# Patient Record
Sex: Female | Born: 1985 | Race: Black or African American | Hispanic: No | Marital: Single | Smoking: Former smoker
Health system: Southern US, Community
[De-identification: ages and names within clinical notes are randomized; demographics above are authoritative.]

## PROBLEM LIST (undated history)

## (undated) DIAGNOSIS — K5792 Diverticulitis of intestine, part unspecified, without perforation or abscess without bleeding: Secondary | ICD-10-CM

## (undated) DIAGNOSIS — G43909 Migraine, unspecified, not intractable, without status migrainosus: Secondary | ICD-10-CM

## (undated) DIAGNOSIS — E785 Hyperlipidemia, unspecified: Secondary | ICD-10-CM

## (undated) DIAGNOSIS — J45909 Unspecified asthma, uncomplicated: Secondary | ICD-10-CM

## (undated) HISTORY — PX: OTHER SURGICAL HISTORY: SHX169

## (undated) HISTORY — DX: Migraine, unspecified, not intractable, without status migrainosus: G43.909

## (undated) HISTORY — DX: Hyperlipidemia, unspecified: E78.5

## (undated) HISTORY — DX: Diverticulitis of intestine, part unspecified, without perforation or abscess without bleeding: K57.92

---

## 2007-07-05 ENCOUNTER — Emergency Department (HOSPITAL_COMMUNITY): Admission: EM | Admit: 2007-07-05 | Discharge: 2007-07-06 | Payer: Self-pay | Admitting: Emergency Medicine

## 2009-04-13 ENCOUNTER — Emergency Department (HOSPITAL_COMMUNITY): Admission: EM | Admit: 2009-04-13 | Discharge: 2009-04-13 | Payer: Self-pay | Admitting: Emergency Medicine

## 2009-06-28 ENCOUNTER — Emergency Department (HOSPITAL_COMMUNITY): Admission: EM | Admit: 2009-06-28 | Discharge: 2009-06-28 | Payer: Self-pay | Admitting: Emergency Medicine

## 2010-05-22 ENCOUNTER — Emergency Department (HOSPITAL_COMMUNITY)
Admission: EM | Admit: 2010-05-22 | Discharge: 2010-05-22 | Payer: Self-pay | Source: Home / Self Care | Admitting: Emergency Medicine

## 2010-08-05 LAB — RAPID STREP SCREEN (MED CTR MEBANE ONLY): Streptococcus, Group A Screen (Direct): NEGATIVE

## 2011-02-08 LAB — URINALYSIS, ROUTINE W REFLEX MICROSCOPIC
Glucose, UA: NEGATIVE
Ketones, ur: NEGATIVE
Nitrite: NEGATIVE
Protein, ur: NEGATIVE
Urobilinogen, UA: 1

## 2011-02-08 LAB — CBC
MCV: 86.8
Platelets: 429 — ABNORMAL HIGH
WBC: 12 — ABNORMAL HIGH

## 2011-02-08 LAB — DIFFERENTIAL
Basophils Relative: 0
Eosinophils Absolute: 0.1
Lymphs Abs: 2
Neutro Abs: 9.1 — ABNORMAL HIGH
Neutrophils Relative %: 76

## 2011-02-08 LAB — PREGNANCY, URINE: Preg Test, Ur: NEGATIVE

## 2011-02-08 LAB — BASIC METABOLIC PANEL
BUN: 10
Calcium: 8.4
Chloride: 105
Creatinine, Ser: 0.78
GFR calc Af Amer: >60

## 2011-07-16 ENCOUNTER — Ambulatory Visit (INDEPENDENT_AMBULATORY_CARE_PROVIDER_SITE_OTHER): Payer: Self-pay | Admitting: Family Medicine

## 2011-07-16 ENCOUNTER — Ambulatory Visit: Payer: Self-pay

## 2011-07-16 DIAGNOSIS — M25561 Pain in right knee: Secondary | ICD-10-CM

## 2011-07-16 DIAGNOSIS — R0789 Other chest pain: Secondary | ICD-10-CM

## 2011-07-16 DIAGNOSIS — R071 Chest pain on breathing: Secondary | ICD-10-CM

## 2011-07-16 DIAGNOSIS — M25569 Pain in unspecified knee: Secondary | ICD-10-CM

## 2011-07-16 LAB — D-DIMER, QUANTITATIVE: D-Dimer, Quant: 0.33 ug/mL-FEU (ref 0.00–0.48)

## 2011-07-16 MED ORDER — OXAPROZIN 600 MG PO TABS: ORAL_TABLET | ORAL | Status: DC

## 2011-07-16 NOTE — Patient Instructions (Signed)
Take medicine is ordered. If acute chest pain or worsening leg pain return here or go to the emergency room.

## 2011-07-16 NOTE — Progress Notes (Signed)
Subjective:  This is a 26 year old Afro-American female who comes in complaining of a couple of weeks of pain in her right knee and leg and in her left chest. The chest pain has some soreness to it when she rubs her upper chest above the breast. Last night it was sore and tender down into her left arm. She knows of no trauma. She works at [employer] where she has to walk back and forth a lot. She is on her feet the whole time, and has to climb up ladders to get the high shoes. She knows of no specific knee injury either. However it's been pretty painful and sore in the knee and down into the calf. It hurts all the way around to the knees. She does have a history of a possible Baker's cyst layer and the past some years ago. She's been hurting enough that she limped some.  Otherwise she has no major complaints. Cardiorespiratory unremarkable except for history of some asthma. GI unremarkable.  Objective: Neck supple chest clear to auscultation. Chest wall not very tender to palpation. Heart was regular without murmurs gallops arrhythmias. Her right knee seems to have a small effusion at, deftly so compared to the left. No erythema or ecchymosis is noted. There is tenderness all the way around the joint, but there is no joint laxity noted. The calf is mildly tender with negative Homans.  She does have a tiny bruise on the lateral aspect of her leg about 8 or 10 cm above the knee which almost has a linear nature to it.  [facility] reading (PRIMARY) by  Dr. Alwyn Ren. Chest: Essentially normal. Knee x-ray normal.   Assessment:  Left chest wall pain Right knee pain and effusion, with calf tenderness   Plan: EKG, chest x-ray, d-dimer, right knee x-ray Last menstrual period was in early February, 100% condom use.   I think that this is all musculoskeletal. We'll place on NSAID but am awaiting the d-dimer report also. Call or go to the emergency room if worse.

## 2011-07-16 NOTE — Progress Notes (Signed)
Subjective:  This is a 26 year old Afro-American female who comes in complaining of a couple of weeks of pain in her right knee and leg and in her left chest. The chest pain has some soreness to it when she rubs her upper chest above the breast. Last night it was sore and tender down into her left arm. She knows of no trauma. She works at [employer] where she has to walk back and forth a lot. She is on her feet the whole time, and has to climb up ladders to get the high shoes. She knows of no specific knee injury either. However it's been pretty painful and sore in the knee and down into the calf. It hurts all the way around to the knees. She does have a history of a possible Baker's cyst layer and the past some years ago. She's been hurting enough that she limped some.  Otherwise she has no major complaints. Cardiorespiratory unremarkable except for history of some asthma. GI unremarkable.  Objective: Neck supple chest clear to auscultation. Chest wall not very tender to palpation. Heart was regular without murmurs gallops arrhythmias. Her right knee seems to have a small effusion at, deftly so compared to the left. No erythema or ecchymosis is noted. There is tenderness all the way around the joint, but there is no joint laxity noted. The calf is mildly tender with negative Homans.  She does have a tiny bruise on the lateral aspect of her leg about 8 or 10 cm above the knee which almost has a linear nature to it.  [facility] reading (PRIMARY) by  Dr. Alwyn Ren. Chest: Essentially normal. Knee x-ray normal.   Assessment:  Left chest wall pain Right knee pain and effusion, with calf tenderness   Plan: EKG, chest x-ray, d-dimer, right knee x-ray Last menstrual period was in early February, 100% condom use.

## 2011-08-02 ENCOUNTER — Ambulatory Visit (INDEPENDENT_AMBULATORY_CARE_PROVIDER_SITE_OTHER): Payer: Self-pay | Admitting: Family Medicine

## 2011-08-02 ENCOUNTER — Ambulatory Visit: Payer: Self-pay

## 2011-08-02 VITALS — BP 131/86 | HR 91 | Temp 98.4°F | Resp 16

## 2011-08-02 DIAGNOSIS — M25539 Pain in unspecified wrist: Secondary | ICD-10-CM

## 2011-08-02 DIAGNOSIS — M25579 Pain in unspecified ankle and joints of unspecified foot: Secondary | ICD-10-CM

## 2011-08-02 DIAGNOSIS — S6000XA Contusion of unspecified finger without damage to nail, initial encounter: Secondary | ICD-10-CM

## 2011-08-02 DIAGNOSIS — S51809A Unspecified open wound of unspecified forearm, initial encounter: Secondary | ICD-10-CM

## 2011-08-02 DIAGNOSIS — S41109A Unspecified open wound of unspecified upper arm, initial encounter: Secondary | ICD-10-CM

## 2011-08-02 DIAGNOSIS — S93419A Sprain of calcaneofibular ligament of unspecified ankle, initial encounter: Secondary | ICD-10-CM

## 2011-08-02 MED ORDER — HYDROCODONE-ACETAMINOPHEN 5-325 MG PO TABS: 1.0000 | ORAL_TABLET | Freq: Every evening | ORAL | Status: AC | PRN

## 2011-08-02 NOTE — Progress Notes (Signed)
  Subjective:    Patient ID: Stephanie Lowe, female    DOB: [birth year], 26 y.o.   MRN: [id number]  HPI    Review of Systems     Objective:   Physical Exam        Assessment & Plan:  Discussed case and reviewed xrays with Porfirio Oar, PA and agree with plan.
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
## 2011-08-10 ENCOUNTER — Encounter: Payer: Self-pay | Admitting: *Deleted

## 2011-08-25 ENCOUNTER — Emergency Department (HOSPITAL_COMMUNITY)
Admission: EM | Admit: 2011-08-25 | Discharge: 2011-08-25 | Disposition: A | Discharge status: Home / Self Care | Payer: PRIVATE HEALTH INSURANCE | Attending: Emergency Medicine | Admitting: Emergency Medicine

## 2011-08-25 ENCOUNTER — Emergency Department (HOSPITAL_COMMUNITY): Payer: PRIVATE HEALTH INSURANCE

## 2011-08-25 ENCOUNTER — Encounter (HOSPITAL_COMMUNITY): Payer: Self-pay | Admitting: *Deleted

## 2011-08-25 DIAGNOSIS — M25473 Effusion, unspecified ankle: Secondary | ICD-10-CM | POA: Insufficient documentation

## 2011-08-25 DIAGNOSIS — S93409A Sprain of unspecified ligament of unspecified ankle, initial encounter: Secondary | ICD-10-CM | POA: Insufficient documentation

## 2011-08-25 DIAGNOSIS — M25476 Effusion, unspecified foot: Secondary | ICD-10-CM | POA: Insufficient documentation

## 2011-08-25 DIAGNOSIS — S93402A Sprain of unspecified ligament of left ankle, initial encounter: Secondary | ICD-10-CM

## 2011-08-25 MED ORDER — OXYCODONE-ACETAMINOPHEN 5-325 MG PO TABS: 1.0000 | ORAL_TABLET | ORAL | Status: AC | PRN
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
## 2011-08-25 NOTE — Discharge Instructions (Signed)
Ankle Sprain An ankle sprain is an injury to the strong, fibrous tissues (ligaments) that hold the bones of your ankle joint together.  CAUSES Ankle sprain usually is caused by a fall or by twisting your ankle. People who participate in sports are more prone to these types of injuries.  SYMPTOMS  Symptoms of ankle sprain include:  Pain in your ankle. The pain may be present at rest or only when you are trying to stand or walk.   Swelling.   Bruising. Bruising may develop immediately or within 1 to 2 days after your injury.   Difficulty standing or walking.  DIAGNOSIS  Your caregiver will ask you details about your injury and perform a physical exam of your ankle to determine if you have an ankle sprain. During the physical exam, your caregiver will press and squeeze specific areas of your foot and ankle. Your caregiver will try to move your ankle in certain ways. An X-ray exam may be done to be sure a bone was not broken or a ligament did not separate from one of the bones in your ankle (avulsion).  TREATMENT  Certain types of braces can help stabilize your ankle. Your caregiver can make a recommendation for this. Your caregiver may recommend the use of medication for pain. If your sprain is severe, your caregiver may refer you to a surgeon who helps to restore function to parts of your skeletal system (orthopedist) or a physical therapist. HOME CARE INSTRUCTIONS  Apply ice to your injury for 1 to 2 days or as directed by your caregiver. Applying ice helps to reduce inflammation and pain.  Put ice in a plastic bag.   Place a towel between your skin and the bag.   Leave the ice on for 15 to 20 minutes at a time, every 2 hours while you are awake.   Take over-the-counter or prescription medicines for pain, discomfort, or fever only as directed by your caregiver.   Keep your injured leg elevated, when possible, to lessen swelling.   If your caregiver recommends crutches, use them as  instructed. Gradually, put weight on the affected ankle. Continue to use crutches or a cane until you can walk without feeling pain in your ankle.   If you have a plaster splint, wear the splint as directed by your caregiver. Do not rest it on anything harder than a pillow the first 24 hours. Do not put weight on it. Do not get it wet. You may take it off to take a shower or bath.   You may have been given an elastic bandage to wear around your ankle to provide support. If the elastic bandage is too tight (you have numbness or tingling in your foot or your foot becomes cold and blue), adjust the bandage to make it comfortable.   If you have an air splint, you may blow more air into it or let air out to make it more comfortable. You may take your splint off at night and before taking a shower or bath.   Wiggle your toes in the splint several times per day if you are able.  SEEK MEDICAL CARE IF:   You have an increase in bruising, swelling, or pain.   Your toes feel cold.   Pain relief is not achieved with medication.  SEEK IMMEDIATE MEDICAL CARE IF: Your toes are numb or blue or you have severe pain. MAKE SURE YOU:   Understand these instructions.   Will watch your condition.     Will get help right away if you are not doing well or get worse.  Document Released: 05/03/2005 Document Revised: 04/22/2011 Document Reviewed: 12/06/2007 Exit[employer] Patient Information 2012 [employer]. 

## 2011-08-26 NOTE — ED Provider Notes (Signed)
Medical screening examination/treatment/procedure(s) were performed by non-physician practitioner and as supervising physician I was immediately available for consultation/collaboration.    Rebbeca Sheperd, MD 08/26/11 0012 

## 2012-02-23 ENCOUNTER — Encounter (HOSPITAL_COMMUNITY): Payer: Self-pay

## 2012-02-23 ENCOUNTER — Emergency Department (HOSPITAL_COMMUNITY): Payer: Self-pay

## 2012-02-23 ENCOUNTER — Emergency Department (HOSPITAL_COMMUNITY)
Admission: EM | Admit: 2012-02-23 | Discharge: 2012-02-23 | Disposition: A | Discharge status: Home / Self Care | Payer: Self-pay | Attending: Emergency Medicine | Admitting: Emergency Medicine

## 2012-02-23 DIAGNOSIS — R0789 Other chest pain: Secondary | ICD-10-CM | POA: Insufficient documentation

## 2012-02-23 DIAGNOSIS — J45909 Unspecified asthma, uncomplicated: Secondary | ICD-10-CM | POA: Insufficient documentation

## 2012-02-23 DIAGNOSIS — R0602 Shortness of breath: Secondary | ICD-10-CM | POA: Insufficient documentation

## 2012-02-23 HISTORY — DX: Unspecified asthma, uncomplicated: J45.909

## 2012-02-23 LAB — BASIC METABOLIC PANEL
Chloride: 104 mEq/L (ref 96–112)
GFR calc Af Amer: >90 mL/min (ref >90)
Potassium: 4.1 mEq/L (ref 3.5–5.1)

## 2012-02-23 LAB — CBC
Platelets: 401 10*3/uL — ABNORMAL HIGH (ref 150–400)
RBC: 4.13 MIL/uL (ref 3.87–5.11)
RDW: 12.9 % (ref 11.5–15.5)
WBC: 8.4 10*3/uL (ref 4.0–10.5)

## 2012-02-23 LAB — POCT I-STAT TROPONIN I: Troponin i, poc: 0 ng/mL (ref 0.00–0.08)

## 2012-02-23 MED ORDER — ALBUTEROL SULFATE (5 MG/ML) 0.5% IN NEBU
5.0000 mg | INHALATION_SOLUTION | Freq: Once | RESPIRATORY_TRACT | Status: AC
  Administered 2012-02-23: 5 mg via RESPIRATORY_TRACT
  Filled 2012-02-23: qty 0.5
  Filled 2012-02-23: qty 1

## 2012-02-23 NOTE — ED Provider Notes (Signed)
History     CSN: [id number]  Arrival date & time 02/23/12  1513   First MD Initiated Contact with Patient 02/23/12 2013      Chief Complaint  Patient presents with  . Chest Pain  . Shortness of Breath   HPI  History provided by the patient. Patient is a 26 year old female with prior history of asthma who is a current smoker and presents with complaints of persistent left upper chest pain and tenderness for the past 3 weeks. Patient denies any injury or trauma to the area. She reports point tenderness to the upper lateral left chest area worse with palpation. She denies any other aggravating or alleviating factors. Symptoms to have some waxing and waning nature but are persistent. Patient also complains of occasional heart palpitations or skipped beats that are brief lasting 1-2 seconds. Patient also reports a generalized shortness of breath is unchanged with activity or rest has been persistent which longer than her chest pain symptoms. Patient currently is not treated for her asthma is not use any medications for many years. She denies any increasing cough. Denies any wheezing. Denies any fever, chills or sweats. She denies any recent long travel, surgery, prior DVT or PE, prior diagnosis of cancer, estrogen or birth control use or any hemoptysis.    Past Medical History  Diagnosis Date  . Asthma     History reviewed. No pertinent past surgical history.  No family history on file.  History  Substance Use Topics  . Smoking status: Current Every Day Smoker -- 8 years  . Smokeless tobacco: Not on file  . Alcohol Use: 3.8 oz/week    3 Cans of beer, 4 Drinks containing 0.5 oz of alcohol per week    OB History    Grav Para Term Preterm Abortions TAB SAB Ect Mult Living                  Review of Systems  Constitutional: Negative for fever, chills and diaphoresis.  HENT: Negative for congestion and sore throat.   Respiratory: Positive for shortness of breath. Negative for cough  and wheezing.   Cardiovascular: Positive for chest pain and palpitations. Negative for leg swelling.  Gastrointestinal: Negative for nausea, vomiting, abdominal pain, diarrhea and constipation.  Skin: Negative for rash.  Neurological: Negative for weakness, light-headedness and numbness.    Allergies  Review of patient's allergies indicates no known allergies.  Home Medications  No current outpatient prescriptions on file.  BP 133/85  Pulse 79  Temp 99.2 F (37.3 C) (Oral)  Resp 18  Ht 5\' 2"  (1.575 m)  Wt 190 lb (86.183 kg)  BMI 34.75 kg/m2  SpO2 98%  LMP 02/23/2012  Physical Exam  Nursing note and vitals reviewed. Constitutional: She is oriented to person, place, and time. She appears well-developed and well-nourished. No distress.  HENT:  Head: Normocephalic.  Cardiovascular: Normal rate and regular rhythm.   No murmur heard. Pulmonary/Chest: Effort normal. No respiratory distress. She has wheezes. She has no rales. She exhibits tenderness.         Reproducible point tenderness to the left upper lateral chest wall over pectoralis major. No deformities or mass.  Abdominal: Soft. There is no tenderness. There is no rebound and no guarding.  Musculoskeletal: Normal range of motion. She exhibits no edema and no tenderness.       No clinical signs concerning for DVT  Neurological: She is alert and oriented to person, place, and time.  Skin: Skin is  warm and dry. No rash noted. No erythema.  Psychiatric: She has a normal mood and affect. Her behavior is normal.    ED Course  Procedures   Results for orders placed during the hospital encounter of 02/23/12  CBC      Component Value Range   WBC 8.4  4.0 - 10.5 K/uL   RBC 4.13  3.87 - 5.11 MIL/uL   Hemoglobin 12.2  12.0 - 15.0 g/dL   HCT 47.8  29.5 - 62.1 %   MCV 90.1  78.0 - 100.0 fL   MCH 29.5  26.0 - 34.0 pg   MCHC 32.8  30.0 - 36.0 g/dL   RDW 30.8  65.7 - 84.6 %   Platelets 401 (*) 150 - 400 K/uL  BASIC METABOLIC  PANEL      Component Value Range   Sodium 138  135 - 145 mEq/L   Potassium 4.1  3.5 - 5.1 mEq/L   Chloride 104  96 - 112 mEq/L   CO2 23  19 - 32 mEq/L   Glucose, Bld 98  70 - 99 mg/dL   BUN 14  6 - 23 mg/dL   Creatinine, Ser 9.62  0.50 - 1.10 mg/dL   Calcium 9.2  8.4 - 95.2 mg/dL   GFR calc non Af Amer 89 (*) >90 mL/min   GFR calc Af Amer >90  >90 mL/min  POCT I-STAT TROPONIN I      Component Value Range   Troponin i, poc 0.00  0.00 - 0.08 ng/mL   Comment 3               Dg Chest 2 View  02/23/2012  *RADIOLOGY REPORT*  Clinical Data: Chest pain.  Shortness of breath.  CHEST - 2 VIEW  Comparison: 07/16/2011.  Findings: No infiltrate, congestive heart failure or pneumothorax. Heart size within normal limits.  IMPRESSION: No acute abnormality.   Original Report Authenticated By: Fuller Canada, M.D.      1. Musculoskeletal chest pain       MDM  8:40 PM patient seen and evaluated. Patient resting comfortably. Patient with reproducible tenderness over the left upper lateral pectoralis area. No underlying deformities. Patient is PERC negative.  Symptoms have been persistent for the past 3 weeks. Patient has normal lab testing, negative troponin and unremarkable EKG. Does times feels symptoms are not related to a serious more likely muscle skeletal. Patient has been advised of the findings and treatment recommendations. Will discharge at this time     Date: 02/23/2012  Rate: 79  Rhythm: normal sinus rhythm  QRS Axis: normal  Intervals: normal  ST/T Wave abnormalities: normal  Conduction Disutrbances:none  Narrative Interpretation:   Old EKG Reviewed: none available     Angus Seller, [place] 02/23/12 2132

## 2012-02-23 NOTE — ED Notes (Signed)
Pt reports chest pain for 3 weeks. Unable to identify what started it. Upper left chest that radiates to stomach, sharp, "feels like something is pinching it". Pt denies new medications and taking any medication at home, new forms of exercise (but does exercise on a regular). Pt wonders if it is due to anxiety.

## 2012-02-23 NOTE — ED Notes (Signed)
Pt presents with NAD- Pt c/o of upper left sided chest pain x 3 weeks- Pt reports no cause for the pain-

## 2012-02-24 NOTE — ED Provider Notes (Signed)
Medical screening examination/treatment/procedure(s) were performed by non-physician practitioner and as supervising physician I was immediately available for consultation/collaboration.  Gerhard Munch, MD 02/24/12 [phone]

## 2012-09-13 ENCOUNTER — Encounter (HOSPITAL_COMMUNITY): Payer: Self-pay | Admitting: *Deleted

## 2012-09-13 ENCOUNTER — Emergency Department (HOSPITAL_COMMUNITY)
Admission: EM | Admit: 2012-09-13 | Discharge: 2012-09-13 | Disposition: A | Discharge status: Home / Self Care | Payer: Self-pay | Attending: Emergency Medicine | Admitting: Emergency Medicine

## 2012-09-13 DIAGNOSIS — Y9389 Activity, other specified: Secondary | ICD-10-CM | POA: Insufficient documentation

## 2012-09-13 DIAGNOSIS — Y9229 Other specified public building as the place of occurrence of the external cause: Secondary | ICD-10-CM | POA: Insufficient documentation

## 2012-09-13 DIAGNOSIS — J45909 Unspecified asthma, uncomplicated: Secondary | ICD-10-CM | POA: Insufficient documentation

## 2012-09-13 DIAGNOSIS — R112 Nausea with vomiting, unspecified: Secondary | ICD-10-CM | POA: Insufficient documentation

## 2012-09-13 DIAGNOSIS — X500XXA Overexertion from strenuous movement or load, initial encounter: Secondary | ICD-10-CM | POA: Insufficient documentation

## 2012-09-13 DIAGNOSIS — T148XXA Other injury of unspecified body region, initial encounter: Secondary | ICD-10-CM

## 2012-09-13 DIAGNOSIS — F17200 Nicotine dependence, unspecified, uncomplicated: Secondary | ICD-10-CM | POA: Insufficient documentation

## 2012-09-13 DIAGNOSIS — Z3202 Encounter for pregnancy test, result negative: Secondary | ICD-10-CM | POA: Insufficient documentation

## 2012-09-13 DIAGNOSIS — X503XXA Overexertion from repetitive movements, initial encounter: Secondary | ICD-10-CM | POA: Insufficient documentation

## 2012-09-13 DIAGNOSIS — IMO0002 Reserved for concepts with insufficient information to code with codable children: Secondary | ICD-10-CM | POA: Insufficient documentation

## 2012-09-13 DIAGNOSIS — R1012 Left upper quadrant pain: Secondary | ICD-10-CM | POA: Insufficient documentation

## 2012-09-13 LAB — COMPREHENSIVE METABOLIC PANEL
ALT: 14 U/L (ref 0–35)
AST: 18 U/L (ref 0–37)
Alkaline Phosphatase: 52 U/L (ref 39–117)
Calcium: 9.7 mg/dL (ref 8.4–10.5)
GFR calc Af Amer: >90 mL/min (ref >90)
Glucose, Bld: 91 mg/dL (ref 70–99)
Potassium: 3.9 mEq/L (ref 3.5–5.1)
Sodium: 137 mEq/L (ref 135–145)
Total Protein: 7.7 g/dL (ref 6.0–8.3)

## 2012-09-13 LAB — URINALYSIS, ROUTINE W REFLEX MICROSCOPIC
Bilirubin Urine: NEGATIVE
Glucose, UA: NEGATIVE mg/dL
Hgb urine dipstick: NEGATIVE
Specific Gravity, Urine: 1.024 (ref 1.005–1.030)
Urobilinogen, UA: 0.2 mg/dL (ref 0.0–1.0)
pH: 5.5 (ref 5.0–8.0)

## 2012-09-13 LAB — CBC WITH DIFFERENTIAL/PLATELET
Basophils Absolute: 0 10*3/uL (ref 0.0–0.1)
Eosinophils Absolute: 0.2 10*3/uL (ref 0.0–0.7)
Lymphocytes Relative: 24 % (ref 12–46)
Lymphs Abs: 2.4 10*3/uL (ref 0.7–4.0)
Neutrophils Relative %: 66 % (ref 43–77)
Platelets: 416 10*3/uL — ABNORMAL HIGH (ref 150–400)
RBC: 4.53 MIL/uL (ref 3.87–5.11)
RDW: 13 % (ref 11.5–15.5)
WBC: 10 10*3/uL (ref 4.0–10.5)

## 2012-09-13 LAB — POCT PREGNANCY, URINE: Preg Test, Ur: NEGATIVE

## 2012-09-13 MED ORDER — METHOCARBAMOL 500 MG PO TABS: 500.0000 mg | ORAL_TABLET | Freq: Two times a day (BID) | ORAL | Status: DC

## 2012-09-13 MED ORDER — IBUPROFEN 600 MG PO TABS: 600.0000 mg | ORAL_TABLET | Freq: Four times a day (QID) | ORAL | Status: DC | PRN

## 2012-09-13 NOTE — ED Provider Notes (Signed)
History     CSN: [id number]  Arrival date & time 09/13/12  1325   First MD Initiated Contact with Patient 09/13/12 1437      Chief Complaint  Patient presents with  . Abdominal Pain    (Consider location/radiation/quality/duration/timing/severity/associated sxs/prior treatment) HPI  27 year old female presents complaining of abdominal pain. Patient reports for the past week she has had persistent pain to her left upper quadrant abdomen. Describe pain as an aching throbbing sensation also with a poor sensation when she moves. Pain is worse with movement and worse with taking deep breath. She felt nauseous and vomits once that was several days ago. No specific treatment tried. Otherwise patient denies fever, chills, sneezing, coughing, sore throat, chest pain, dyspnea or exertion, productive cough, dysuria, vaginal discharge, or rash. She admits to starting to go to the gym for the past month has been performing some heavy lifting. State if he makes the pain worse. She also admits to drinking moderate amount of alcohol over the weekend. Denies prior history of pancreatitis. Denies history of diabetes. Does not take birth control pill, and had no other significant risk factors for PE.  Past Medical History  Diagnosis Date  . Asthma     History reviewed. No pertinent past surgical history.  History reviewed. No pertinent family history.  History  Substance Use Topics  . Smoking status: Current Every Day Smoker -- 8 years  . Smokeless tobacco: Not on file  . Alcohol Use: 3.8 oz/week    3 Cans of beer, 4 Drinks containing 0.5 oz of alcohol per week    OB History   Grav Para Term Preterm Abortions TAB SAB Ect Mult Living                  Review of Systems  Constitutional:       10 Systems reviewed and all are negative for acute change except as noted in the HPI.     Allergies  Review of patient's allergies indicates no known allergies.  Home Medications  No current  outpatient prescriptions on file.  BP 130/87  Pulse 87  Temp(Src) 99 F (37.2 C) (Oral)  Resp 20  SpO2 98%  LMP 08/28/2012  Physical Exam  Nursing note and vitals reviewed. Constitutional: She appears well-developed and well-nourished. No distress.  Awake, alert, nontoxic appearance  HENT:  Head: Atraumatic.  Eyes: Conjunctivae are normal. Right eye exhibits no discharge. Left eye exhibits no discharge.  Neck: Neck supple.  Cardiovascular: Normal rate and regular rhythm.   Pulmonary/Chest: Effort normal. No respiratory distress. She has no wheezes. She has no rales. She exhibits tenderness (tenderness to left paracostal region without crepitus, emphysema, or overlying skin changes).  Abdominal: Soft. Bowel sounds are normal. There is tenderness (Mild left upper quadrant tenderness without guarding or rebound tenderness.). There is no rebound.  Genitourinary:  No CVA tenderness  Musculoskeletal: She exhibits no tenderness.  ROM appears intact, no obvious focal weakness  Neurological:  Mental status and motor strength appears intact  Skin: No rash noted.  Psychiatric: She has a normal mood and affect.    ED Course  Procedures (including critical care time)  2:54 PM Patient with pain to the left upper quadrant, reproducible on exam. Pain is likely to be musculoskeletal in origin considering patient has recently started to go to the gym and workout. Since patient also admits to moderate alcohol use over the weekend, we'll consider pancreatitis as a possible cause. Labs will be checked in ED.  Pain medication offered, patient declined. Patient otherwise afebrile with stable normal vital sign. She has a nonsurgical abdomen.  3:55 PM Patient has normal lipase which makes her unlikely to have pancreatitis. Her labs otherwise unremarkable. I believe the pain is likely MSK. I recommend over-the-counter  ibuprofen, muscle relaxant. Return precautions discussed. Patient is stable for  discharge. All questions answered to patient's satisfaction. I recommend alcohol cessation.  Labs Reviewed  CBC WITH DIFFERENTIAL - Abnormal; Notable for the following:    Platelets 416 (*)    All other components within normal limits  COMPREHENSIVE METABOLIC PANEL - Abnormal; Notable for the following:    Total Bilirubin 0.2 (*)    All other components within normal limits  URINALYSIS, ROUTINE W REFLEX MICROSCOPIC - Abnormal; Notable for the following:    APPearance CLOUDY (*)    All other components within normal limits  LIPASE, BLOOD  POCT PREGNANCY, URINE   No results found.   1. Abdominal pain, left upper quadrant   2. Muscle strain       MDM  BP 130/87  Pulse 87  Temp(Src) 99 F (37.2 C) (Oral)  Resp 20  SpO2 98%  LMP 08/28/2012  I have reviewed nursing notes and vital signs. I personally reviewed the imaging tests through PACS system  I reviewed available ER/hospitalization records thought the EMR         Fayrene Helper, [place] 09/13/12 1603

## 2012-09-13 NOTE — ED Provider Notes (Signed)
Medical screening examination/treatment/procedure(s) were conducted as a shared visit with non-physician practitioner(s) and myself.  I personally evaluated the patient during the encounter Pt w left upper abd pain/abd wall pain. Worse w movement/turning. No nv. No fevers. No cough or sob. No cp.. No pleuritic pain. abd soft nt.   Suzi Roots, MD 09/13/12 [phone]

## 2012-09-13 NOTE — ED Notes (Addendum)
Pt c/o left upper abd pain x's 1 week. Reports nausea denies vomiting. Reports pain is worse with deep breath. Pt points under left breast when asked where is pain.

## 2012-09-13 NOTE — Progress Notes (Signed)
ED CM noted without coverage or PCP. Meet with patient, states, she will have health insurance through employer. She will obtain a PCP once she receives insurance card in mail. She states, she will return a copy of insurance card to registration.

## 2013-04-10 ENCOUNTER — Encounter (HOSPITAL_COMMUNITY): Payer: Self-pay | Admitting: Emergency Medicine

## 2013-04-10 ENCOUNTER — Emergency Department (HOSPITAL_COMMUNITY)
Admission: EM | Admit: 2013-04-10 | Discharge: 2013-04-10 | Disposition: A | Discharge status: Home / Self Care | Payer: Self-pay | Attending: Emergency Medicine | Admitting: Emergency Medicine

## 2013-04-10 ENCOUNTER — Emergency Department (HOSPITAL_COMMUNITY): Payer: Self-pay

## 2013-04-10 DIAGNOSIS — T148XXA Other injury of unspecified body region, initial encounter: Secondary | ICD-10-CM

## 2013-04-10 DIAGNOSIS — F17200 Nicotine dependence, unspecified, uncomplicated: Secondary | ICD-10-CM | POA: Insufficient documentation

## 2013-04-10 DIAGNOSIS — R109 Unspecified abdominal pain: Secondary | ICD-10-CM | POA: Insufficient documentation

## 2013-04-10 DIAGNOSIS — K2970 Gastritis, unspecified, without bleeding: Secondary | ICD-10-CM

## 2013-04-10 DIAGNOSIS — J45909 Unspecified asthma, uncomplicated: Secondary | ICD-10-CM | POA: Insufficient documentation

## 2013-04-10 DIAGNOSIS — Z3202 Encounter for pregnancy test, result negative: Secondary | ICD-10-CM | POA: Insufficient documentation

## 2013-04-10 LAB — CBC WITH DIFFERENTIAL/PLATELET
Basophils Absolute: 0 10*3/uL (ref 0.0–0.1)
Eosinophils Relative: 2 % (ref 0–5)
HCT: 39.7 % (ref 36.0–46.0)
Hemoglobin: 13.1 g/dL (ref 12.0–15.0)
Lymphocytes Relative: 20 % (ref 12–46)
Lymphs Abs: 1.7 10*3/uL (ref 0.7–4.0)
MCV: 89.8 fL (ref 78.0–100.0)
Monocytes Absolute: 0.6 10*3/uL (ref 0.1–1.0)
Monocytes Relative: 7 % (ref 3–12)
Neutro Abs: 6.1 10*3/uL (ref 1.7–7.7)
RBC: 4.42 MIL/uL (ref 3.87–5.11)
RDW: 12.7 % (ref 11.5–15.5)
WBC: 8.6 10*3/uL (ref 4.0–10.5)

## 2013-04-10 LAB — COMPREHENSIVE METABOLIC PANEL
AST: 14 U/L (ref 0–37)
CO2: 22 mEq/L (ref 19–32)
Calcium: 9.1 mg/dL (ref 8.4–10.5)
Chloride: 106 mEq/L (ref 96–112)
Creatinine, Ser: 0.83 mg/dL (ref 0.50–1.10)
GFR calc Af Amer: >90 mL/min (ref >90)
GFR calc non Af Amer: >90 mL/min (ref >90)
Glucose, Bld: 99 mg/dL (ref 70–99)
Total Bilirubin: 0.3 mg/dL (ref 0.3–1.2)

## 2013-04-10 LAB — URINE MICROSCOPIC-ADD ON

## 2013-04-10 LAB — URINALYSIS, ROUTINE W REFLEX MICROSCOPIC
Glucose, UA: NEGATIVE mg/dL
Ketones, ur: NEGATIVE mg/dL
Leukocytes, UA: NEGATIVE
Protein, ur: NEGATIVE mg/dL
Urobilinogen, UA: 0.2 mg/dL (ref 0.0–1.0)

## 2013-04-10 MED ORDER — RANITIDINE HCL 150 MG PO TABS: 150.0000 mg | ORAL_TABLET | Freq: Two times a day (BID) | ORAL | Status: DC

## 2013-04-10 MED ORDER — CYCLOBENZAPRINE HCL 10 MG PO TABS: 10.0000 mg | ORAL_TABLET | Freq: Two times a day (BID) | ORAL | Status: DC | PRN

## 2013-04-10 NOTE — Progress Notes (Signed)
[facility] provided pt with a list of primary care resources and a [facility] [employer] [facility] application.  °

## 2013-04-10 NOTE — ED Notes (Signed)
Pt c/o lt upper side/flank pain x3wks after stretching. Pt states felt a knot in the center of her abdominal and moved to the side. Pt denies n/v/diarrhea or problems urinating

## 2013-04-10 NOTE — ED Provider Notes (Signed)
CSN: [id number]     Arrival date & time 04/10/13  0844 History   First MD Initiated Contact with Patient 04/10/13 [phone]     Chief Complaint  Patient presents with  . Flank Pain   (Consider location/radiation/quality/duration/timing/severity/associated sxs/prior Treatment) HPI Comments: Patient presents to the ER for evaluation of left abdomen and flank pain. Patient reports that she started having pain in the left upper abdomen area approximately 3 weeks ago when she was bent over stretching. Since then the pain has been constant but mild to moderate. She reports that today, however, she has had increased pain, now more severe and radiating around the left side to the back. No nausea, vomiting, diarrhea or constipation. She denies urinary symptoms. Patient does report that she had a few drinks the other night and that made the pain worse.  Patient is a 27 y.o. female presenting with flank pain.  Flank Pain Associated symptoms include abdominal pain. Pertinent negatives include no shortness of breath.    Past Medical History  Diagnosis Date  . Asthma    No past surgical history on file. No family history on file. History  Substance Use Topics  . Smoking status: Current Every Day Smoker -- 8 years  . Smokeless tobacco: Not on file  . Alcohol Use: 3.8 oz/week    3 Cans of beer, 4 Drinks containing 0.5 oz of alcohol per week   OB History   Grav Para Term Preterm Abortions TAB SAB Ect Mult Living                 Review of Systems  Respiratory: Negative for cough and shortness of breath.   Gastrointestinal: Positive for abdominal pain.  Genitourinary: Positive for flank pain.  All other systems reviewed and are negative.    Allergies  Review of patient's allergies indicates no known allergies.  Home Medications  No current outpatient prescriptions on file. There were no vitals taken for this visit. Physical Exam  Constitutional: She is oriented to person, place, and time.  She appears well-developed and well-nourished. No distress.  HENT:  Head: Normocephalic and atraumatic.  Right Ear: Hearing normal.  Left Ear: Hearing normal.  Nose: Nose normal.  Mouth/Throat: Oropharynx is clear and moist and mucous membranes are normal.  Eyes: Conjunctivae and EOM are normal. Pupils are equal, round, and reactive to light.  Neck: Normal range of motion. Neck supple.  Cardiovascular: Regular rhythm, S1 normal and S2 normal.  Exam reveals no gallop and no friction rub.   No murmur heard. Pulmonary/Chest: Effort normal and breath sounds normal. No respiratory distress. She exhibits no tenderness.  Abdominal: Soft. Normal appearance and bowel sounds are normal. There is no hepatosplenomegaly. There is tenderness in the epigastric area and left upper quadrant. There is no rebound, no guarding, no tenderness at McBurney's point and negative Murphy's sign. No hernia.  No tenderness below the umbilicus  Musculoskeletal: Normal range of motion.  Neurological: She is alert and oriented to person, place, and time. She has normal strength. No cranial nerve deficit or sensory deficit. Coordination normal. GCS eye subscore is 4. GCS verbal subscore is 5. GCS motor subscore is 6.  Skin: Skin is warm, dry and intact. No rash noted. No cyanosis.  Psychiatric: She has a normal mood and affect. Her speech is normal and behavior is normal. Thought content normal.    ED Course  Procedures (including critical care time) Labs Review Labs Reviewed  CBC WITH DIFFERENTIAL  COMPREHENSIVE METABOLIC PANEL  LIPASE, BLOOD  URINALYSIS, ROUTINE W REFLEX MICROSCOPIC   Imaging Review No results found.  EKG Interpretation   None       MDM  Diagnosis: Abdominal and flank pain  Patient comes to the ER for evaluation of left upper patient has been going on for several weeks. She reports it started when she bent over and stretched. A double wall muscle strain would be considered a strong  possibility. She has, however, now has some increased pain and the pain is now going around into the back and flank area. She says that it worsened when she drank alcohol. Peptic ulcer disease, gastritis, etc. would also be considered. Blood work is normal. No sign of anemia. All labs were normal and urinalysis was unremarkable. No consideration for urinary tract infection, pyelonephritis, ureterolithiasis. Patient will be treated empirically for muscle strain and possible gastritis secondary to her alcohol intake several days ago.    Gilda Crease, MD 04/10/13 [phone]

## 2014-02-07 IMAGING — CR DG CHEST 2V
2 series · 2 of 2 positions shown · non-contrast
Comparison: None.

CLINICAL DATA: Chest pain for 2 weeks.

CHEST - 2 VIEW

[PA]
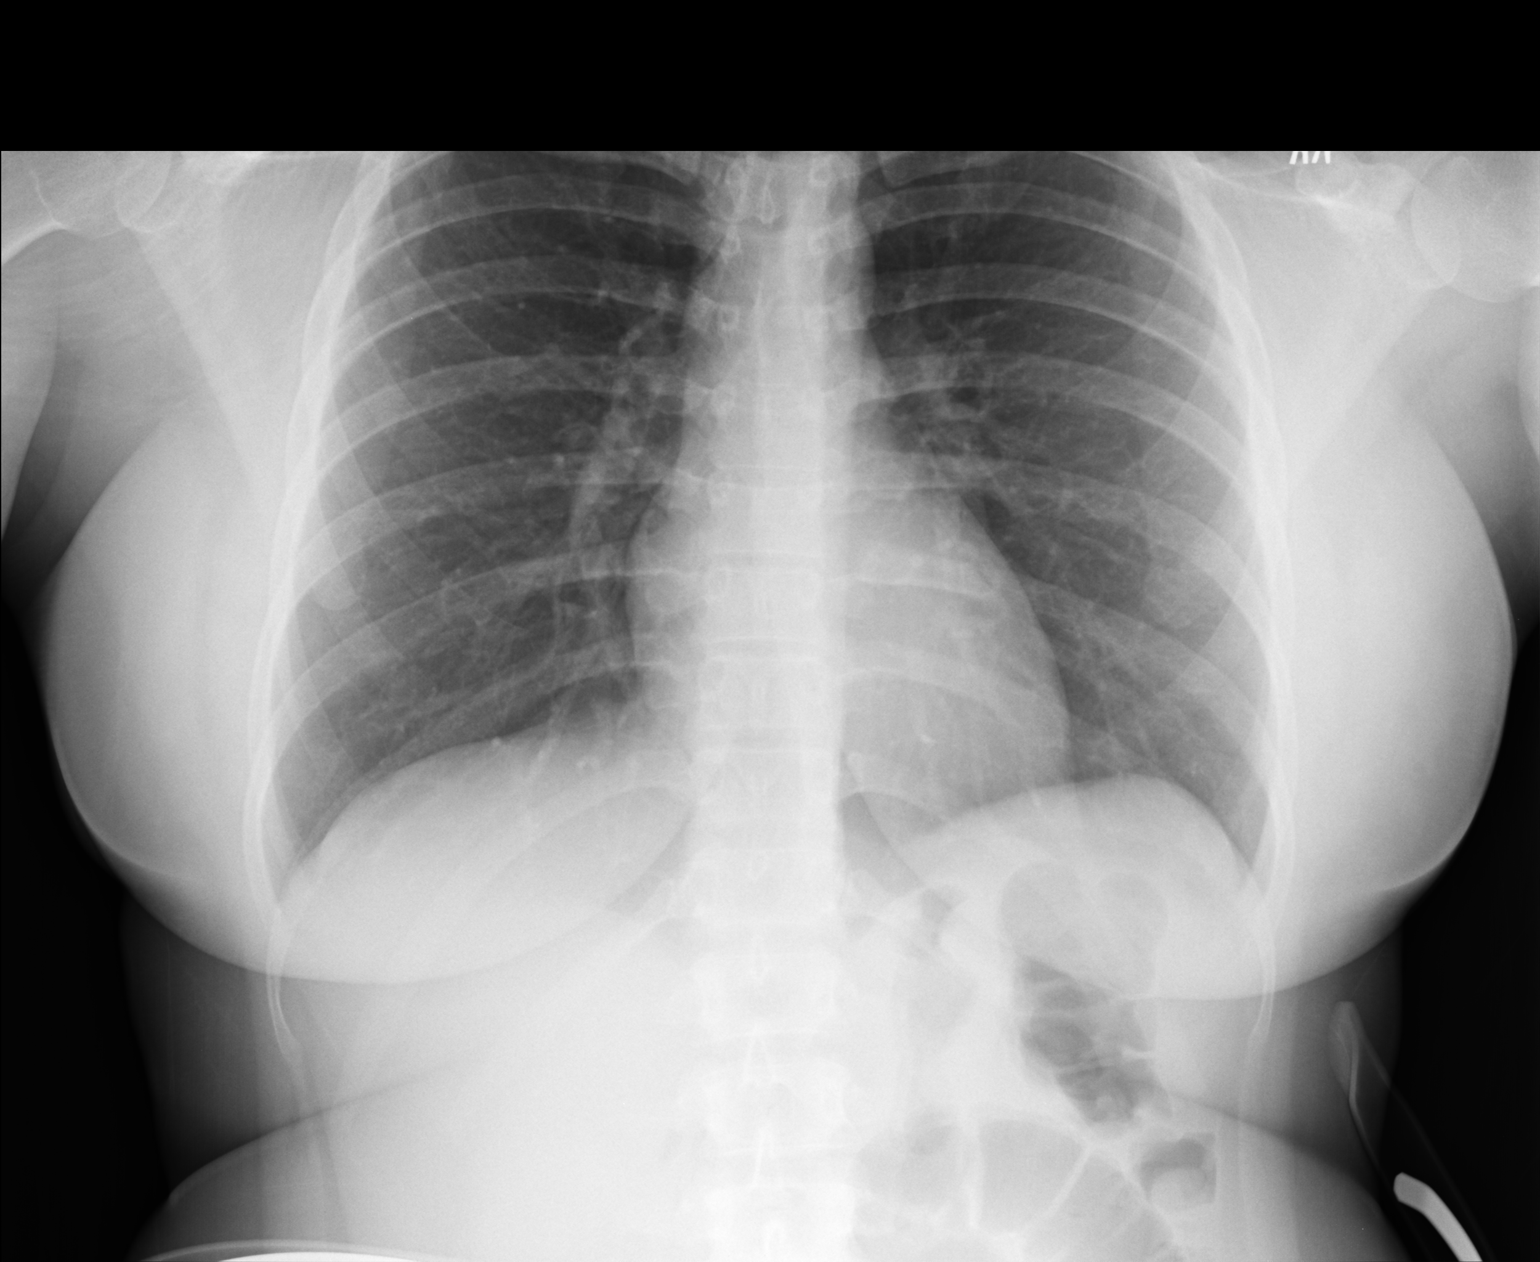

[lateral]
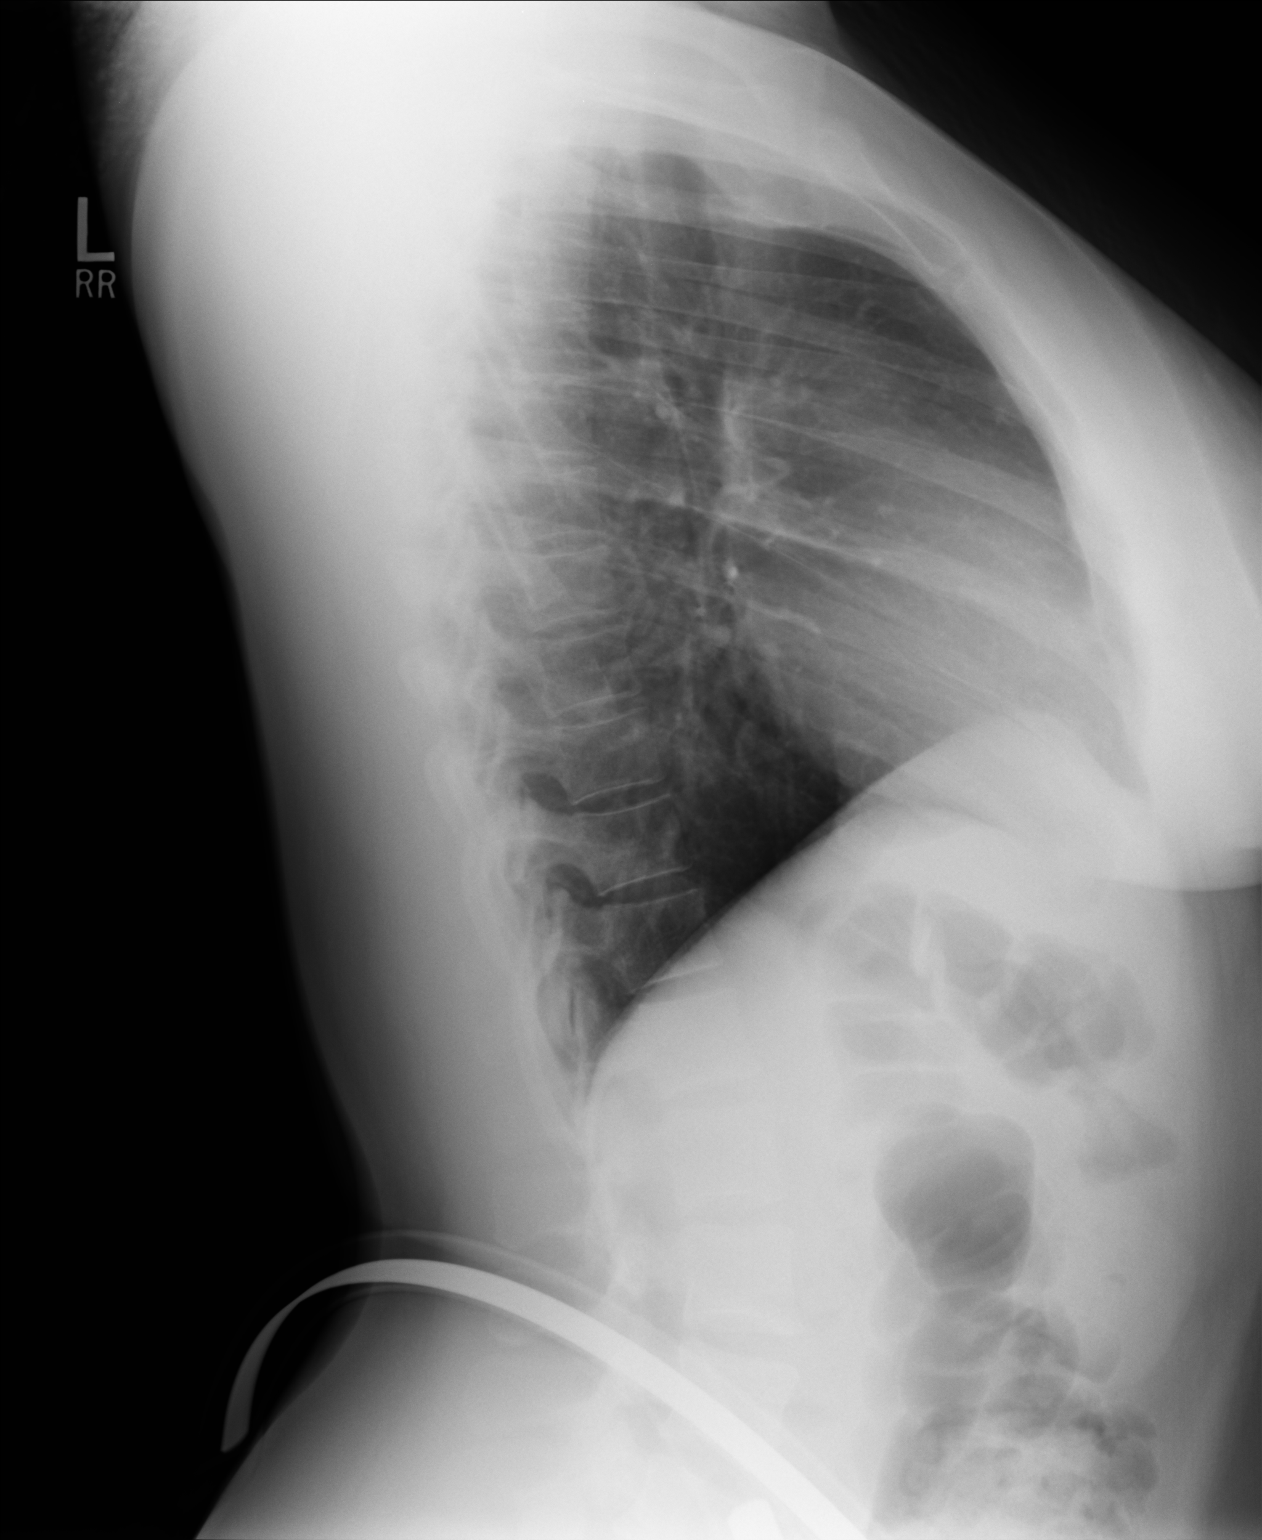

[2 of 2 positions shown; findings below may reference images not displayed]

FINDINGS: The heart size and mediastinal contours are normal. The
lungs are clear. There is no pleural effusion or pneumothorax. No
acute osseous findings are identified.  There is apparent artifact
from the patient's hair overlapping the upper chest bilaterally.
IMPRESSION: No active cardiopulmonary process.

## 2014-08-02 ENCOUNTER — Encounter (HOSPITAL_COMMUNITY): Payer: Self-pay

## 2014-08-02 ENCOUNTER — Emergency Department (HOSPITAL_COMMUNITY)
Admission: EM | Admit: 2014-08-02 | Discharge: 2014-08-02 | Disposition: A | Discharge status: Home / Self Care | Payer: 59 | Attending: Emergency Medicine | Admitting: Emergency Medicine

## 2014-08-02 DIAGNOSIS — J029 Acute pharyngitis, unspecified: Secondary | ICD-10-CM | POA: Insufficient documentation

## 2014-08-02 DIAGNOSIS — R000 Tachycardia, unspecified: Secondary | ICD-10-CM | POA: Diagnosis not present

## 2014-08-02 DIAGNOSIS — Z720 Tobacco use: Secondary | ICD-10-CM | POA: Diagnosis not present

## 2014-08-02 DIAGNOSIS — Z79899 Other long term (current) drug therapy: Secondary | ICD-10-CM | POA: Diagnosis not present

## 2014-08-02 DIAGNOSIS — J45909 Unspecified asthma, uncomplicated: Secondary | ICD-10-CM | POA: Insufficient documentation

## 2014-08-02 LAB — MONONUCLEOSIS SCREEN: MONO SCREEN: NEGATIVE

## 2014-08-02 LAB — RAPID STREP SCREEN (MED CTR MEBANE ONLY): Streptococcus, Group A Screen (Direct): NEGATIVE

## 2014-08-02 MED ORDER — CLINDAMYCIN HCL 150 MG PO CAPS: 300.0000 mg | ORAL_CAPSULE | Freq: Three times a day (TID) | ORAL | Status: DC

## 2014-08-02 NOTE — ED Notes (Signed)
Pt complains of a sore throat with white patches since Monday

## 2014-08-02 NOTE — ED Provider Notes (Signed)
CSN: [id number]     Arrival date & time 08/02/14  0700 History   First MD Initiated Contact with Patient 08/02/14 [phone]     Chief Complaint  Patient presents with  . Sore Throat     Patient is a 29 y.o. female presenting with pharyngitis. The history is provided by the patient. No language interpreter was used.  Sore Throat   Ms. Stephanie Lowe presents for evaluation of sore throat. She developed pain in the left side of her throat 4 days ago, which later spread to the right side of her throat. She noticed that there is multiple white patches in the back of her throat. She denies any fevers, nausea, vomiting, cough. She feels as if there is also mucous in her throat and at times she is short of breath from this and she has pain with swallowing. She was seen in urgent care 4 days ago and had a negative strep swab at that time and was started on steroids. She states that when they did the strep swab they didn't touch her tonsils. Symptoms are moderate, constant, worsening.  Past Medical History  Diagnosis Date  . Asthma    History reviewed. No pertinent past surgical history. History reviewed. No pertinent family history. History  Substance Use Topics  . Smoking status: Current Every Day Smoker -- 8 years  . Smokeless tobacco: Not on file  . Alcohol Use: 3.8 oz/week    3 Cans of beer, 4 Standard drinks or equivalent per week   OB History    No data available     Review of Systems  All other systems reviewed and are negative.     Allergies  Review of patient's allergies indicates no known allergies.  Home Medications   Prior to Admission medications   Medication Sig Start Date End Date Taking? Authorizing Provider  cyclobenzaprine (FLEXERIL) 10 MG tablet Take 1 tablet (10 mg total) by mouth 2 (two) times daily as needed for muscle spasms. 04/10/13   Gilda Crease, MD  ranitidine (ZANTAC) 150 MG tablet Take 1 tablet (150 mg total) by mouth 2 (two) times daily. 04/10/13    Gilda Crease, MD   BP 141/96 mmHg  Pulse 104  Temp(Src) 99.3 F (37.4 C) (Oral)  Resp 20  SpO2 99%  LMP 08/02/2014 Physical Exam  Constitutional: She is oriented to person, place, and time. She appears well-developed and well-nourished.  HENT:  Head: Normocephalic and atraumatic.  Right Ear: External ear normal.  Left Ear: External ear normal.  Moderate bilateral tonsillar swelling, left greater than right with multiple ulcerations and white patches.  Eyes: Pupils are equal, round, and reactive to light.  Neck: Neck supple.  Mild cervical lymphadenopathy  Cardiovascular: Regular rhythm.   No murmur heard. Tachycardic  Pulmonary/Chest: Effort normal and breath sounds normal. No respiratory distress.  Abdominal: Soft. There is no tenderness. There is no rebound and no guarding.  Musculoskeletal: She exhibits no edema or tenderness.  Neurological: She is alert and oriented to person, place, and time.  Skin: Skin is warm and dry.  Psychiatric: She has a normal mood and affect. Her behavior is normal.  Nursing note and vitals reviewed.   ED Course  Procedures (including critical care time) Labs Review Labs Reviewed  RAPID STREP SCREEN  CULTURE, GROUP A STREP  MONONUCLEOSIS SCREEN    Imaging Review No results found.   EKG Interpretation None      MDM   Final diagnoses:  Acute pharyngitis, unspecified  pharyngitis type    Patient here for tonsillar swelling, pain, exudate. Exam consistent with pharyngitis. Given tonsillar asymmetry concern for very early PTA and will treat with clindamycin with close return precautions. Patient is nontoxic on exam with no evidence of respiratory distress was tolerating oral fluids without difficulty.    Tilden FossaElizabeth Rayssa Atha, MD 08/02/14 [phone]

## 2014-08-02 NOTE — Discharge Instructions (Signed)

## 2014-08-04 ENCOUNTER — Ambulatory Visit (INDEPENDENT_AMBULATORY_CARE_PROVIDER_SITE_OTHER): Payer: 59 | Admitting: Emergency Medicine

## 2014-08-04 VITALS — BP 122/82 | HR 118 | Temp 97.6°F | Resp 16

## 2014-08-04 DIAGNOSIS — J039 Acute tonsillitis, unspecified: Secondary | ICD-10-CM | POA: Diagnosis not present

## 2014-08-04 DIAGNOSIS — R0789 Other chest pain: Secondary | ICD-10-CM

## 2014-08-04 DIAGNOSIS — M940 Chondrocostal junction syndrome [Tietze]: Secondary | ICD-10-CM | POA: Diagnosis not present

## 2014-08-04 LAB — CULTURE, GROUP A STREP: STREP A CULTURE: NEGATIVE

## 2014-08-04 MED ORDER — NAPROXEN SODIUM 550 MG PO TABS: 550.0000 mg | ORAL_TABLET | Freq: Two times a day (BID) | ORAL | Status: AC

## 2014-08-04 MED ORDER — AMOXICILLIN-POT CLAVULANATE 875-125 MG PO TABS: 1.0000 | ORAL_TABLET | Freq: Two times a day (BID) | ORAL | Status: DC

## 2014-08-04 MED ORDER — HYDROCODONE-ACETAMINOPHEN 5-325 MG PO TABS: 1.0000 | ORAL_TABLET | ORAL | Status: DC | PRN

## 2014-08-04 NOTE — Progress Notes (Signed)
[facility] [address] [place] [address] [phone]  Date:  08/04/2014   Name:  Stephanie Lowe   DOB:  February 27, 1986   MRN:  [id number]  PCP:  No primary care provider on file.    Chief Complaint: Chest Pain and Sore Throat   History of Present Illness:  Stephanie Lowe is a 29 y.o. very pleasant female patient who presents with the following:  Patient gives a history of sore throat, swollen tonsils and an exudate She was seen in an [facility] and on 3/16 in ER.  Had negative strep and mono screens but culture not reported. Says fever has been "high". She has an intermittent cough that is not productive. Over past 24 hours developed a left anterior parasternal pain that is pleuritic No history of overuse or injury No nausea or vomiting. Says throat is worse No improvement with over the counter medications or other home remedies Has no history of HBP, HLD, DM, occasional social smoker.  No family history of premature CAD Premenopausal. Denies other complaint or health concern today.   There are no active problems to display for this patient.   Past Medical History  Diagnosis Date  . Asthma     No past surgical history on file.  History  Substance Use Topics  . Smoking status: Current Every Day Smoker -- 8 years  . Smokeless tobacco: Not on file  . Alcohol Use: 3.8 oz/week    3 Cans of beer, 4 Standard drinks or equivalent per week    No family history on file.  No Known Allergies  Medication list has been reviewed and updated.  Current Outpatient Prescriptions on File Prior to Visit  Medication Sig Dispense Refill  . clindamycin (CLEOCIN) 150 MG capsule Take 2 capsules (300 mg total) by mouth 3 (three) times daily. 42 capsule 0  . albuterol (PROVENTIL HFA;VENTOLIN HFA) 108 (90 BASE) MCG/ACT inhaler Inhale 1-2 puffs into the lungs every 6 (six) hours as needed for wheezing or shortness of breath.    . cyclobenzaprine (FLEXERIL) 10 MG  tablet Take 1 tablet (10 mg total) by mouth 2 (two) times daily as needed for muscle spasms. (Patient not taking: Reported on 08/04/2014) 20 tablet 0  . nitrofurantoin, macrocrystal-monohydrate, (MACROBID) 100 MG capsule Take 100 mg by mouth 2 (two) times daily.    Marland Kitchen PREDNISONE PO Take 20 mg by mouth daily.     . ranitidine (ZANTAC) 150 MG tablet Take 1 tablet (150 mg total) by mouth 2 (two) times daily. (Patient not taking: Reported on 08/04/2014) 60 tablet 0   No current facility-administered medications on file prior to visit.    Review of Systems:  As per HPI, otherwise negative.    Physical Examination: Filed Vitals:   08/04/14 1211  BP: 122/82  Pulse: 118  Temp: 97.6 F (36.4 C)  Resp: 16   There were no vitals filed for this visit. There is no weight on file to calculate BMI. Ideal Body Weight:    GEN: WDWN, NAD, Non-toxic, A & O x 3 HEENT: Atraumatic, Normocephalic. Neck supple. No masses, No LAD.  Symmetrical exudative tonsillitis Ears and Nose: No external deformity. CV: RRR, No M/G/R. No JVD. No thrill. No extra heart sounds. PULM: CTA B, no wheezes, crackles, rhonchi. No retractions. No resp. distress. No accessory muscle use.  Left inferior parasternal tenderness.  Reproduces her chest pain ABD: S, NT, ND, +BS. No rebound. No HSM. EXTR: No c/c/e NEURO Normal gait.  PSYCH: Normally interactive. Conversant. Not depressed or anxious appearing.  Calm demeanor.    Assessment and Plan: Tonsillitis Costochondritis vicodin augmentin  Signed,  Phillips OdorJeffery Anderson, MD

## 2014-08-04 NOTE — Patient Instructions (Signed)
Costochondritis Costochondritis, sometimes called Tietze syndrome, is a swelling and irritation (inflammation) of the tissue (cartilage) that connects your ribs with your breastbone (sternum). It causes pain in the chest and rib area. Costochondritis usually goes away on its own over time. It can take up to 6 weeks or longer to get better, especially if you are unable to limit your activities. CAUSES  Some cases of costochondritis have no known cause. Possible causes include:  Injury (trauma).  Exercise or activity such as lifting.  Severe coughing. SIGNS AND SYMPTOMS  Pain and tenderness in the chest and rib area.  Pain that gets worse when coughing or taking deep breaths.  Pain that gets worse with specific movements. DIAGNOSIS  Your health care provider will do a physical exam and ask about your symptoms. Chest X-rays or other tests may be done to rule out other problems. TREATMENT  Costochondritis usually goes away on its own over time. Your health care provider may prescribe medicine to help relieve pain. HOME CARE INSTRUCTIONS   Avoid exhausting physical activity. Try not to strain your ribs during normal activity. This would include any activities using chest, abdominal, and side muscles, especially if heavy weights are used.  Apply ice to the affected area for the first 2 days after the pain begins.  Put ice in a plastic bag.  Place a towel between your skin and the bag.  Leave the ice on for 20 minutes, 2-3 times a day.  Only take over-the-counter or prescription medicines as directed by your health care provider. SEEK MEDICAL CARE IF:  You have redness or swelling at the rib joints. These are signs of infection.  Your pain does not go away despite rest or medicine. SEEK IMMEDIATE MEDICAL CARE IF:   Your pain increases or you are very uncomfortable.  You have shortness of breath or difficulty breathing.  You cough up blood.  You have worse chest pains,  sweating, or vomiting.  You have a fever or persistent symptoms for more than 2-3 days.  You have a fever and your symptoms suddenly get worse. MAKE SURE YOU:   Understand these instructions.  Will watch your condition.  Will get help right away if you are not doing well or get worse. Document Released: 02/10/2005 Document Revised: 02/21/2013 Document Reviewed: 12/05/2012 [facility] Patient Information 2015 [place], [place]. This information is not intended to replace advice given to you by your health care provider. Make sure you discuss any questions you have with your health care provider. Tonsillitis Tonsillitis is an infection of the throat that causes the tonsils to become red, tender, and swollen. Tonsils are collections of lymphoid tissue at the back of the throat. Each tonsil has crevices (crypts). Tonsils help fight nose and throat infections and keep infection from spreading to other parts of the body for the first 18 months of life.  CAUSES Sudden (acute) tonsillitis is usually caused by infection with streptococcal bacteria. Long-lasting (chronic) tonsillitis occurs when the crypts of the tonsils become filled with pieces of food and bacteria, which makes it easy for the tonsils to become repeatedly infected. SYMPTOMS  Symptoms of tonsillitis include:  A sore throat, with possible difficulty swallowing.  White patches on the tonsils.  Fever.  Tiredness.  New episodes of snoring during sleep, when you did not snore before.  Small, foul-smelling, yellowish-white pieces of material (tonsilloliths) that you occasionally cough up or spit out. The tonsilloliths can also cause you to have bad breath. DIAGNOSIS Tonsillitis can be  diagnosed through a physical exam. Diagnosis can be confirmed with the results of lab tests, including a throat culture. TREATMENT  The goals of tonsillitis treatment include the reduction of the severity and duration of symptoms and prevention of  associated conditions. Symptoms of tonsillitis can be improved with the use of steroids to reduce the swelling. Tonsillitis caused by bacteria can be treated with antibiotic medicines. Usually, treatment with antibiotic medicines is started before the cause of the tonsillitis is known. However, if it is determined that the cause is not bacterial, antibiotic medicines will not treat the tonsillitis. If attacks of tonsillitis are severe and frequent, your health care provider may recommend surgery to remove the tonsils (tonsillectomy). HOME CARE INSTRUCTIONS   Rest as much as possible and get plenty of sleep.  Drink plenty of fluids. While the throat is very sore, eat soft foods or liquids, such as sherbet, soups, or instant breakfast drinks.  Eat frozen ice pops.  Gargle with a warm or cold liquid to help soothe the throat. Mix 1/4 teaspoon of salt and 1/4 teaspoon of baking soda in 8 oz of water. SEEK MEDICAL CARE IF:   Large, tender lumps develop in your neck.  A rash develops.  A green, yellow-brown, or bloody substance is coughed up.  You are unable to swallow liquids or food for 24 hours.  You notice that only one of the tonsils is swollen. SEEK IMMEDIATE MEDICAL CARE IF:   You develop any new symptoms such as vomiting, severe headache, stiff neck, chest pain, or trouble breathing or swallowing.  You have severe throat pain along with drooling or voice changes.  You have severe pain, unrelieved with recommended medications.  You are unable to fully open the mouth.  You develop redness, swelling, or severe pain anywhere in the neck.  You have a fever. MAKE SURE YOU:   Understand these instructions.  Will watch your condition.  Will get help right away if you are not doing well or get worse. Document Released: 02/10/2005 Document Revised: 09/17/2013 Document Reviewed: 10/20/2012 [facility] Patient Information 2015 [place]. This information is not intended to replace  advice given to you by your health care provider. Make sure you discuss any questions you have with your health care provider.

## 2014-08-08 ENCOUNTER — Emergency Department (HOSPITAL_COMMUNITY)
Admission: EM | Admit: 2014-08-08 | Discharge: 2014-08-08 | Disposition: A | Discharge status: Home / Self Care | Payer: 59 | Attending: Emergency Medicine | Admitting: Emergency Medicine

## 2014-08-08 ENCOUNTER — Emergency Department (HOSPITAL_COMMUNITY): Payer: 59

## 2014-08-08 DIAGNOSIS — J45909 Unspecified asthma, uncomplicated: Secondary | ICD-10-CM | POA: Insufficient documentation

## 2014-08-08 DIAGNOSIS — Z720 Tobacco use: Secondary | ICD-10-CM | POA: Diagnosis not present

## 2014-08-08 DIAGNOSIS — Z79899 Other long term (current) drug therapy: Secondary | ICD-10-CM | POA: Diagnosis not present

## 2014-08-08 DIAGNOSIS — R091 Pleurisy: Secondary | ICD-10-CM

## 2014-08-08 DIAGNOSIS — R079 Chest pain, unspecified: Secondary | ICD-10-CM

## 2014-08-08 DIAGNOSIS — Z792 Long term (current) use of antibiotics: Secondary | ICD-10-CM | POA: Diagnosis not present

## 2014-08-08 LAB — CBC
HCT: 41.4 % (ref 36.0–46.0)
HEMOGLOBIN: 13.5 g/dL (ref 12.0–15.0)
MCH: 29.8 pg (ref 26.0–34.0)
MCHC: 32.6 g/dL (ref 30.0–36.0)
MCV: 91.4 fL (ref 78.0–100.0)
Platelets: 445 10*3/uL — ABNORMAL HIGH (ref 150–400)
RBC: 4.53 MIL/uL (ref 3.87–5.11)
RDW: 12.6 % (ref 11.5–15.5)
WBC: 9.9 10*3/uL (ref 4.0–10.5)

## 2014-08-08 LAB — BASIC METABOLIC PANEL
ANION GAP: 12 (ref 5–15)
BUN: 10 mg/dL (ref 6–23)
CHLORIDE: 103 mmol/L (ref 96–112)
CO2: 25 mmol/L (ref 19–32)
CREATININE: 0.97 mg/dL (ref 0.50–1.10)
Calcium: 9.3 mg/dL (ref 8.4–10.5)
GFR calc Af Amer: >90 mL/min (ref >90)
GFR calc non Af Amer: 79 mL/min — ABNORMAL LOW (ref >90)
Glucose, Bld: 109 mg/dL — ABNORMAL HIGH (ref 70–99)
Potassium: 3.7 mmol/L (ref 3.5–5.1)
Sodium: 140 mmol/L (ref 135–145)

## 2014-08-08 LAB — I-STAT TROPONIN, ED: Troponin i, poc: 0 ng/mL (ref 0.00–0.08)

## 2014-08-08 LAB — D-DIMER, QUANTITATIVE: D-Dimer, Quant: 0.28 ug/mL-FEU (ref 0.00–0.48)

## 2014-08-08 MED ORDER — IBUPROFEN 600 MG PO TABS: 600.0000 mg | ORAL_TABLET | Freq: Three times a day (TID) | ORAL | Status: DC | PRN

## 2014-08-08 MED ORDER — SODIUM CHLORIDE 0.9 % IV SOLN: 250.0000 mL | INTRAVENOUS | Status: DC | PRN

## 2014-08-08 MED ORDER — SODIUM CHLORIDE 0.9 % IJ SOLN
3.0000 mL | INTRAMUSCULAR | Status: DC | PRN
Start: 2014-08-08 — End: 2014-08-08

## 2014-08-08 MED ORDER — KETOROLAC TROMETHAMINE 30 MG/ML IJ SOLN
30.0000 mg | Freq: Once | INTRAMUSCULAR | Status: AC
  Administered 2014-08-08: 30 mg via INTRAVENOUS
  Filled 2014-08-08: qty 1

## 2014-08-08 MED ORDER — SODIUM CHLORIDE 0.9 % IJ SOLN
3.0000 mL | Freq: Two times a day (BID) | INTRAMUSCULAR | Status: DC
  Administered 2014-08-08: 3 mL via INTRAVENOUS

## 2014-08-08 NOTE — ED Notes (Signed)
Pt diagnosed with pharyngitis last week, c/o chest heaviness and pain.

## 2014-08-08 NOTE — Discharge Instructions (Signed)

## 2014-08-08 NOTE — ED Provider Notes (Signed)
CSN: [id number]     Arrival date & time 08/08/14  1203 History   First MD Initiated Contact with Patient 08/08/14 1418     Chief Complaint  Patient presents with  . Chest Pain     The history is provided by the patient.  patient reports recent upper respiratory tract infection with associated pharyngitis.  Patient is currently on antibiotics.  She reports that her throat is feeling much better but now she is having pleuritic left-sided chest discomfort.  No history DVT or pulmonary embolism.  There is a family history of pulmonary embolism with her mother on anticoagulants at this time.  Patient denies unilateral leg swelling.  No significant shortness of breath.  She has a history of asthma but reports this feels different and denies shortness of breath.  No productive cough.  She states her pain is worse with deep breathing.    Past Medical History  Diagnosis Date  . Asthma    No past surgical history on file. No family history on file. History  Substance Use Topics  . Smoking status: Current Every Day Smoker -- 8 years  . Smokeless tobacco: Not on file  . Alcohol Use: 3.8 oz/week    3 Cans of beer, 4 Standard drinks or equivalent per week   OB History    No data available     Review of Systems  All other systems reviewed and are negative.     Allergies  Review of patient's allergies indicates no known allergies.  Home Medications   Prior to Admission medications   Medication Sig Start Date End Date Taking? Authorizing Provider  albuterol (PROVENTIL HFA;VENTOLIN HFA) 108 (90 BASE) MCG/ACT inhaler Inhale 1-2 puffs into the lungs every 6 (six) hours as needed for wheezing or shortness of breath.   Yes Historical Provider, MD  amoxicillin (AMOXIL) 500 MG capsule Take 500 mg by mouth 2 (two) times daily.   Yes Historical Provider, MD  HYDROcodone-acetaminophen (NORCO) 5-325 MG per tablet Take 1-2 tablets by mouth every 4 (four) hours as needed. 08/04/14  Yes Carmelina Dane, MD  amoxicillin-clavulanate (AUGMENTIN) 875-125 MG per tablet Take 1 tablet by mouth 2 (two) times daily. 08/04/14   Carmelina Dane, MD  clindamycin (CLEOCIN) 150 MG capsule Take 2 capsules (300 mg total) by mouth 3 (three) times daily. 08/02/14   Tilden Fossa, MD  cyclobenzaprine (FLEXERIL) 10 MG tablet Take 1 tablet (10 mg total) by mouth 2 (two) times daily as needed for muscle spasms. Patient not taking: Reported on 08/04/2014 04/10/13   Gilda Crease, MD  ibuprofen (ADVIL,MOTRIN) 600 MG tablet Take 1 tablet (600 mg total) by mouth every 8 (eight) hours as needed. 08/08/14   Azalia Bilis, MD  naproxen sodium (ANAPROX DS) 550 MG tablet Take 1 tablet (550 mg total) by mouth 2 (two) times daily with a meal. 08/04/14 08/04/15  Carmelina Dane, MD  ranitidine (ZANTAC) 150 MG tablet Take 1 tablet (150 mg total) by mouth 2 (two) times daily. Patient not taking: Reported on 08/04/2014 04/10/13   Gilda Crease, MD   BP 123/87 mmHg  Pulse 88  Temp(Src) 98.4 F (36.9 C) (Oral)  Resp 18  SpO2 100%  LMP 08/02/2014 Physical Exam  Constitutional: She is oriented to person, place, and time. She appears well-developed and well-nourished. No distress.  HENT:  Head: Normocephalic and atraumatic.  Mild posterior pharyngeal erythema with exudates.  Uvula is midline.  Eyes: EOM are normal.  Neck: Normal  range of motion.  Cardiovascular: Normal rate, regular rhythm and normal heart sounds.   Pulmonary/Chest: Effort normal and breath sounds normal. She exhibits no tenderness.  Abdominal: Soft. She exhibits no distension. There is no tenderness.  Musculoskeletal: Normal range of motion.  Neurological: She is alert and oriented to person, place, and time.  Skin: Skin is warm and dry. No rash noted.  Psychiatric: She has a normal mood and affect. Judgment normal.  Nursing note and vitals reviewed.   ED Course  Procedures (including critical care time) Labs Review Labs  Reviewed  CBC - Abnormal; Notable for the following:    Platelets 445 (*)    All other components within normal limits  BASIC METABOLIC PANEL - Abnormal; Notable for the following:    Glucose, Bld 109 (*)    GFR calc non Af Amer 79 (*)    All other components within normal limits  D-DIMER, QUANTITATIVE  I-STAT TROPOININ, ED    Imaging Review Dg Chest 2 View  08/08/2014   CLINICAL DATA:  Pharyngitis with chest heaviness and pain.  EXAM: CHEST  2 VIEW  COMPARISON:  04/10/2013  FINDINGS: Normal heart size and mediastinal contours. No acute infiltrate or edema. No effusion or pneumothorax. No acute osseous findings.  IMPRESSION: Negative chest.   Electronically Signed   By: Marnee SpringJonathon  Watts M.D.   On: 08/08/2014 13:02     EKG Interpretation   Date/Time:  Thursday August 08 2014 12:17:02 EDT Ventricular Rate:  102 PR Interval:  124 QRS Duration: 85 QT Interval:  324 QTC Calculation: 422 R Axis:   72 Text Interpretation:  Sinus tachycardia Borderline T wave abnormalities No  significant change was found Confirmed by Brittanny Levenhagen  MD, Caryn BeeKEVIN ([id number]) on  08/08/2014 2:45:51 PM      MDM   Final diagnoses:  Pleurisy  Chest pain, unspecified chest pain type    Patient feels much better at this time.  Discharge home in good condition.  D-dimer negative.  Chest x-ray clear.  Posterior pharynx is improved clinically.  Primary care follow-up.    Azalia BilisKevin Ladarryl Wrage, MD 08/08/14 [phone]

## 2015-11-03 ENCOUNTER — Ambulatory Visit: Payer: 59

## 2015-11-03 NOTE — Patient Instructions (Signed)
     IF you received an x-ray today, you will receive an invoice from [facility]. Please contact [facility] at [phone] with questions or concerns regarding your invoice.   IF you received labwork today, you will receive an invoice from [facility]. Please contact [facility] at [phone] with questions or concerns regarding your invoice.   Our billing staff will not be able to assist you with questions regarding bills from these companies.  You will be contacted with the lab results as soon as they are available. The fastest way to get your results is to activate your My Chart account. Instructions are located on the last page of this paperwork. If you have not heard from us regarding the results in 2 weeks, please contact this office.      

## 2016-08-16 ENCOUNTER — Ambulatory Visit (INDEPENDENT_AMBULATORY_CARE_PROVIDER_SITE_OTHER): Payer: 59 | Admitting: Podiatry

## 2016-08-16 ENCOUNTER — Ambulatory Visit (INDEPENDENT_AMBULATORY_CARE_PROVIDER_SITE_OTHER): Payer: 59

## 2016-08-16 DIAGNOSIS — M2140 Flat foot [pes planus] (acquired), unspecified foot: Secondary | ICD-10-CM

## 2016-08-16 DIAGNOSIS — S93402A Sprain of unspecified ligament of left ankle, initial encounter: Secondary | ICD-10-CM | POA: Diagnosis not present

## 2016-08-16 DIAGNOSIS — M76829 Posterior tibial tendinitis, unspecified leg: Secondary | ICD-10-CM

## 2016-08-16 DIAGNOSIS — T148XXA Other injury of unspecified body region, initial encounter: Secondary | ICD-10-CM

## 2016-08-17 ENCOUNTER — Telehealth: Payer: Self-pay | Admitting: *Deleted

## 2016-08-17 DIAGNOSIS — M76829 Posterior tibial tendinitis, unspecified leg: Secondary | ICD-10-CM

## 2016-08-17 DIAGNOSIS — T148XXA Other injury of unspecified body region, initial encounter: Secondary | ICD-10-CM

## 2016-08-17 MED ORDER — MELOXICAM 15 MG PO TABS: 15.0000 mg | ORAL_TABLET | Freq: Every day | ORAL | 2 refills | Status: AC
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
## 2016-08-17 NOTE — Telephone Encounter (Addendum)
-----   Message from Vivi Barrack, DPM sent at 08/17/2016  7:41 AM EDT ----- Can you please order an MRI of the left ankle/foot to rule out a posterior tibial tendon tear. She has pain in the ankle and going into the foot. If they only allow one, please do the ankle. Gave orders to [facility] to pre-cert and faxed to [facility].08/31/2016-Pt states she scheduled her appt for MRI results and is scheduled for the end of next week and feels that is too long to wait for results. 09/01/2016-DrArdelle Anton states MRI shows areas of inflammation, no tear and pt would benefit from PT from [facility] for tendonitis, and lateral ankle instability. I informed pt of Dr. Gabriel Rung review of results and orders, pt states understanding. I told her we could cancel the appt for Monday and reschedule for 4 weeks out after PT, unless she was having a problem. Pt states that would be fine, she has pain all the time. I asked pt if she was taking the Meloxicam and she stated she did not take the Meloxicam because she did not like the side effects. I told pt that with medication, vitamins or even OTC medications there were side effects that she needed to weigh the benefits of the Meloxicam for comfort and antiinflammatory properties for her diagnosis. Pt states understanding. Pt okayed cancellation of Monday appt. Orders faxed to [facility].

## 2016-08-23 ENCOUNTER — Other Ambulatory Visit: Payer: Self-pay | Admitting: Podiatry

## 2016-08-23 ENCOUNTER — Telehealth: Payer: Self-pay | Admitting: *Deleted

## 2016-08-23 NOTE — Telephone Encounter (Signed)
"  Stephanie Lowe is scheduled for a MRI on the 16th.  She has [facility] and it needs authorization."

## 2016-08-23 NOTE — Telephone Encounter (Signed)
I called and gave Altame[facility] Oiler the authorization numbers for the foot and ankle.  The codes are [phone] and [phone].  They expire on 10/07/2016.

## 2016-08-30 ENCOUNTER — Ambulatory Visit
Admission: RE | Admit: 2016-08-30 | Discharge: 2016-08-30 | Disposition: A | Discharge status: Home / Self Care | Payer: 59 | Source: Ambulatory Visit | Attending: Podiatry | Admitting: Podiatry

## 2016-09-06 ENCOUNTER — Ambulatory Visit: Payer: 59 | Admitting: Podiatry

## 2016-09-09 ENCOUNTER — Ambulatory Visit: Payer: 59 | Admitting: Podiatry

## 2016-12-23 DIAGNOSIS — J4540 Moderate persistent asthma, uncomplicated: Secondary | ICD-10-CM | POA: Insufficient documentation

## 2016-12-23 DIAGNOSIS — F418 Other specified anxiety disorders: Secondary | ICD-10-CM | POA: Insufficient documentation

## 2017-08-04 ENCOUNTER — Encounter (HOSPITAL_COMMUNITY): Payer: Self-pay | Admitting: Emergency Medicine

## 2017-08-04 ENCOUNTER — Emergency Department (HOSPITAL_COMMUNITY): Payer: Managed Care, Other (non HMO)

## 2017-08-04 ENCOUNTER — Other Ambulatory Visit: Payer: Self-pay

## 2017-08-04 ENCOUNTER — Emergency Department (HOSPITAL_COMMUNITY)
Admission: EM | Admit: 2017-08-04 | Discharge: 2017-08-04 | Disposition: A | Discharge status: Home / Self Care | Payer: Managed Care, Other (non HMO) | Attending: Emergency Medicine | Admitting: Emergency Medicine

## 2017-08-04 DIAGNOSIS — Z79899 Other long term (current) drug therapy: Secondary | ICD-10-CM | POA: Diagnosis not present

## 2017-08-04 DIAGNOSIS — J45909 Unspecified asthma, uncomplicated: Secondary | ICD-10-CM | POA: Insufficient documentation

## 2017-08-04 DIAGNOSIS — R0789 Other chest pain: Secondary | ICD-10-CM

## 2017-08-04 DIAGNOSIS — F17210 Nicotine dependence, cigarettes, uncomplicated: Secondary | ICD-10-CM | POA: Diagnosis not present

## 2017-08-04 DIAGNOSIS — R079 Chest pain, unspecified: Secondary | ICD-10-CM | POA: Diagnosis present

## 2017-08-04 LAB — I-STAT BETA HCG BLOOD, ED (MC, WL, AP ONLY): I-stat hCG, quantitative: <5 m[IU]/mL (ref <5)

## 2017-08-04 LAB — I-STAT TROPONIN, ED: TROPONIN I, POC: 0 ng/mL (ref 0.00–0.08)

## 2017-08-04 LAB — BASIC METABOLIC PANEL
Anion gap: 9 (ref 5–15)
BUN: 13 mg/dL (ref 6–20)
CHLORIDE: 107 mmol/L (ref 101–111)
CO2: 24 mmol/L (ref 22–32)
CREATININE: 0.86 mg/dL (ref 0.44–1.00)
Calcium: 8.8 mg/dL — ABNORMAL LOW (ref 8.9–10.3)
GFR calc non Af Amer: >60 mL/min (ref >60)
GLUCOSE: 92 mg/dL (ref 65–99)
Potassium: 4.4 mmol/L (ref 3.5–5.1)
Sodium: 140 mmol/L (ref 135–145)

## 2017-08-04 LAB — CBC
HCT: 40.6 % (ref 36.0–46.0)
Hemoglobin: 13.5 g/dL (ref 12.0–15.0)
MCH: 29.9 pg (ref 26.0–34.0)
MCHC: 33.3 g/dL (ref 30.0–36.0)
MCV: 89.8 fL (ref 78.0–100.0)
Platelets: 339 10*3/uL (ref 150–400)
RBC: 4.52 MIL/uL (ref 3.87–5.11)
RDW: 12.7 % (ref 11.5–15.5)
WBC: 5.5 10*3/uL (ref 4.0–10.5)

## 2017-08-04 MED ORDER — GABAPENTIN 300 MG PO CAPS
300.0000 mg | ORAL_CAPSULE | Freq: Once | ORAL | Status: AC
  Administered 2017-08-04: 300 mg via ORAL
  Filled 2017-08-04: qty 1

## 2017-08-04 MED ORDER — IBUPROFEN 600 MG PO TABS: 600.0000 mg | ORAL_TABLET | Freq: Three times a day (TID) | ORAL | 0 refills | Status: DC | PRN

## 2017-08-04 MED ORDER — GABAPENTIN 100 MG PO CAPS: 100.0000 mg | ORAL_CAPSULE | Freq: Three times a day (TID) | ORAL | 0 refills | Status: DC

## 2017-08-04 MED ORDER — CYCLOBENZAPRINE HCL 10 MG PO TABS: 10.0000 mg | ORAL_TABLET | Freq: Two times a day (BID) | ORAL | 0 refills | Status: DC | PRN

## 2017-08-04 NOTE — Discharge Instructions (Addendum)
Take medications as prescribed and follow up closely with your doctor for further care.  If you develop productive cough with blood, or if you develop a rash to the affected area, please return for further care .

## 2017-08-04 NOTE — ED Provider Notes (Signed)
[facility]-EMERGENCY DEPT Provider Note   CSN: [id number] Arrival date & time: 08/04/17  1009     History   Chief Complaint Chief Complaint  Patient presents with  . Cough  . Chest Pain    HPI Stephanie Lowe is a 32 y.o. female.  HPI   32 year old female presenting for evaluation of chest pain.  Patient mentioned for the past 2 weeks she has had persistent pain to her left chest wall.  She described pain as a sharp throbbing sensitive sensation that is persistent, worsening with movement or with palpation to the affected area.  Sometimes putting on her bra or when things brushed against it it can cause pain.  Rates pain as 8 out of 10.  She denies any specific injury or heavy lifting that may have caused this before.  She has been taken aspirin at home with minimal improvement.  She does have trouble with wheezing but this is not new and she is not using her inhaler more than usual.  Several days ago she also  report pain to her left thigh.  She was seen at an outside facility 2 days ago for her symptoms.  She had a chest x-ray, labs including basic metabolic panel, CBC, a venous Doppler study of her left lower extremity, a d-dimer, and all of which were within normal limits.  She is here because her pain still persist.  She does report family history of blood clot however she denies any significant risk factors such as recent travel, prolonged bed rest, active cancer, on oral hormones, recent surgery, or recent travel.  No prior history of shingle.  Contrary to initial chief complaint of cough, patient denies any coughing or hemoptysis.  Past Medical History:  Diagnosis Date  . Asthma     There are no active problems to display for this patient.   History reviewed. No pertinent surgical history.  OB History   None      Home Medications    Prior to Admission medications   Medication Sig Start Date End Date Taking? Authorizing Provider  albuterol  (PROVENTIL HFA;VENTOLIN HFA) 108 (90 BASE) MCG/ACT inhaler Inhale 1-2 puffs into the lungs every 6 (six) hours as needed for wheezing or shortness of breath. Reported on 11/03/2015    [provider]  amoxicillin (AMOXIL) 500 MG capsule Take 500 mg by mouth 2 (two) times daily. Reported on 11/03/2015    [provider]  amoxicillin-clavulanate (AUGMENTIN) 875-125 MG per tablet Take 1 tablet by mouth 2 (two) times daily. Patient not taking: Reported on 11/03/2015 08/04/14   Carmelina DaneAnderson, Jeffery S, MD  clindamycin (CLEOCIN) 150 MG capsule Take 2 capsules (300 mg total) by mouth 3 (three) times daily. Patient not taking: Reported on 11/03/2015 08/02/14   Tilden Fossaees, Elizabeth, MD  cyclobenzaprine (FLEXERIL) 10 MG tablet Take 1 tablet (10 mg total) by mouth 2 (two) times daily as needed for muscle spasms. Patient not taking: Reported on 08/04/2014 04/10/13   Gilda CreasePollina, Christopher J, MD  HYDROcodone-acetaminophen (NORCO) 5-325 MG per tablet Take 1-2 tablets by mouth every 4 (four) hours as needed. Patient not taking: Reported on 11/03/2015 08/04/14   Carmelina DaneAnderson, Jeffery S, MD  ibuprofen (ADVIL,MOTRIN) 600 MG tablet Take 1 tablet (600 mg total) by mouth every 8 (eight) hours as needed. Patient not taking: Reported on 11/03/2015 08/08/14   Azalia Bilisampos, Kevin, MD  meloxicam (MOBIC) 15 MG tablet Take 1 tablet (15 mg total) by mouth daily. 08/17/16 08/17/17  Vivi BarrackWagoner, Matthew R,  DPM  ranitidine (ZANTAC) 150 MG tablet Take 1 tablet (150 mg total) by mouth 2 (two) times daily. Patient not taking: Reported on 08/04/2014 04/10/13   Gilda Crease, MD    Family History No family history on file.  Social History Social History   Tobacco Use  . Smoking status: Current Every Day Smoker    Years: 8.00  Substance Use Topics  . Alcohol use: Yes    Alcohol/week: 3.8 oz    Types: 3 Cans of beer, 4 Standard drinks or equivalent per week  . Drug use: No     Allergies   Patient has no known allergies.   Review  of Systems Review of Systems  All other systems reviewed and are negative.    Physical Exam Updated Vital Signs BP 119/86 (BP Location: Right Arm)   Pulse (!) 110   Temp 99.4 F (37.4 C) (Oral)   Resp 18   LMP 07/09/2017 (Exact Date)   SpO2 100%   Physical Exam  Constitutional: She appears well-developed and well-nourished. No distress.  Obese female, well-appearing, sitting up in bed in no acute discomfort.  HENT:  Head: Atraumatic.  Eyes: Conjunctivae are normal.  Neck: Neck supple.  Cardiovascular: Normal rate, regular rhythm, intact distal pulses and normal pulses. Exam reveals no gallop and no friction rub.  No murmur heard. Pulmonary/Chest: Effort normal and breath sounds normal. She has no decreased breath sounds. She has no wheezes. She has no rhonchi. She has no rales.    Abdominal: Soft. There is no tenderness.  Musculoskeletal:       Right lower leg: She exhibits no edema.       Left lower leg: She exhibits no edema.  Neurological: She is alert.  Skin: No rash noted.  Psychiatric: She has a normal mood and affect.  Nursing note and vitals reviewed.    ED Treatments / Results  Labs (all labs ordered are listed, but only abnormal results are displayed) Labs Reviewed  BASIC METABOLIC PANEL - Abnormal; Notable for the following components:      Result Value   Calcium 8.8 (*)    All other components within normal limits  CBC  I-STAT TROPONIN, ED  I-STAT BETA HCG BLOOD, ED ([facility], [facility], [facility] ONLY)    EKG  EKG Interpretation None      Date: 08/04/2017  Rate: 94  Rhythm: normal sinus rhythm  QRS Axis: normal  Intervals: normal  ST/T Wave abnormalities: normal  Conduction Disutrbances: none  Narrative Interpretation:   Old EKG Reviewed: No significant changes noted    Radiology Dg Chest 2 View  Result Date: 08/04/2017 CLINICAL DATA:  32 year old female with left chest pain radiating front to back and under the left breast with no known injury. EXAM:  CHEST - 2 VIEW COMPARISON:  08/08/2014 radiographs and earlier. FINDINGS: The heart size and mediastinal contours are within normal limits. Both lungs are clear. No pneumothorax or pleural effusion. The visualized skeletal structures are unremarkable. Negative visible bowel gas pattern. No pneumoperitoneum. IMPRESSION: Negative.  No acute cardiopulmonary abnormality. Electronically Signed   By: Odessa Fleming M.D.   On: 08/04/2017 11:26    Procedures Procedures (including critical care time)  Medications Ordered in ED Medications  gabapentin (NEURONTIN) capsule 300 mg (has no administration in time range)     Initial Impression / Assessment and Plan / ED Course  I have reviewed the triage vital signs and the nursing notes.  Pertinent labs & imaging results that were  available during my care of the patient were reviewed by me and considered in my medical decision making (see chart for details).     BP (!) 123/106 (BP Location: Right Arm)   Pulse (!) 104   Temp 99.4 F (37.4 C) (Oral)   Resp (!) 23   LMP 07/09/2017 (Exact Date)   SpO2 100%    Final Clinical Impressions(s) / ED Diagnoses   Final diagnoses:  Chest wall pain    ED Discharge Orders        Ordered    cyclobenzaprine (FLEXERIL) 10 MG tablet  2 times daily PRN     08/04/17 1641    ibuprofen (ADVIL,MOTRIN) 600 MG tablet  Every 8 hours PRN     08/04/17 1641    gabapentin (NEURONTIN) 100 MG capsule  3 times daily     08/04/17 1641     4:06 PM Patient here with left-sided chest wall pain radiates to her back.  Pain is reproducible on exam and appears to follow a dermatomal pattern but however there is no cardiology concerning rash.  Pain is atypical for ACS.  Pain appears to suggest chest wall pain or potential shingle.  However there is no concerning rash at this time.  Patient was seen at an outside facility for the exact complaint 2 days ago.  She had a normal left lower extremity venous Doppler ultrasound negative for  DVT, her labs at that time was normal, normal chest x-ray, normal EKG and normal troponin.  She also had a normal d-dimer therefore the likelihood of this being a PE is very low.  She is not hypoxic.  At this time, encourage patient to follow-up with her primary care provider for further evaluation of her condition.  I will prescribe Neurontin for potential nerve pain causing her symptoms. We did discussed option of obtaining chest CTA, and we felt CTA is not indicated at this time due to risk/benefit. Return precautions discussed.   Fayrene Helper, PA-C 08/04/17 1642    Linwood Dibbles, MD 08/07/17 [phone]

## 2017-08-04 NOTE — ED Triage Notes (Signed)
Pt verbalizes continued left chest pain for 2 weeks post cough; cough resolved.

## 2018-04-11 ENCOUNTER — Emergency Department (HOSPITAL_COMMUNITY): Payer: Managed Care, Other (non HMO)

## 2018-04-11 ENCOUNTER — Encounter (HOSPITAL_COMMUNITY): Payer: Self-pay

## 2018-04-11 ENCOUNTER — Emergency Department (HOSPITAL_COMMUNITY)
Admission: EM | Admit: 2018-04-11 | Discharge: 2018-04-11 | Disposition: A | Discharge status: Home / Self Care | Payer: Managed Care, Other (non HMO) | Attending: Emergency Medicine | Admitting: Emergency Medicine

## 2018-04-11 ENCOUNTER — Encounter: Payer: Self-pay | Admitting: Physician Assistant

## 2018-04-11 ENCOUNTER — Other Ambulatory Visit: Payer: Self-pay

## 2018-04-11 DIAGNOSIS — Z87891 Personal history of nicotine dependence: Secondary | ICD-10-CM | POA: Insufficient documentation

## 2018-04-11 DIAGNOSIS — Z79899 Other long term (current) drug therapy: Secondary | ICD-10-CM | POA: Insufficient documentation

## 2018-04-11 DIAGNOSIS — J45909 Unspecified asthma, uncomplicated: Secondary | ICD-10-CM | POA: Diagnosis not present

## 2018-04-11 DIAGNOSIS — R1011 Right upper quadrant pain: Secondary | ICD-10-CM | POA: Diagnosis present

## 2018-04-11 LAB — COMPREHENSIVE METABOLIC PANEL
ALBUMIN: 4.3 g/dL (ref 3.5–5.0)
ALT: 21 U/L (ref 0–44)
ANION GAP: 9 (ref 5–15)
AST: 16 U/L (ref 15–41)
Alkaline Phosphatase: 47 U/L (ref 38–126)
BILIRUBIN TOTAL: 0.2 mg/dL — AB (ref 0.3–1.2)
BUN: 10 mg/dL (ref 6–20)
CO2: 24 mmol/L (ref 22–32)
Calcium: 8.8 mg/dL — ABNORMAL LOW (ref 8.9–10.3)
Chloride: 106 mmol/L (ref 98–111)
Creatinine, Ser: 0.79 mg/dL (ref 0.44–1.00)
GFR calc Af Amer: >60 mL/min (ref >60)
Glucose, Bld: 93 mg/dL (ref 70–99)
POTASSIUM: 3.6 mmol/L (ref 3.5–5.1)
Sodium: 139 mmol/L (ref 135–145)
TOTAL PROTEIN: 7.9 g/dL (ref 6.5–8.1)

## 2018-04-11 LAB — URINALYSIS, ROUTINE W REFLEX MICROSCOPIC
BILIRUBIN URINE: NEGATIVE
GLUCOSE, UA: NEGATIVE mg/dL
Ketones, ur: NEGATIVE mg/dL
LEUKOCYTES UA: NEGATIVE
NITRITE: NEGATIVE
PROTEIN: NEGATIVE mg/dL
Specific Gravity, Urine: 1.019 (ref 1.005–1.030)
pH: 5 (ref 5.0–8.0)

## 2018-04-11 LAB — CBC
HEMATOCRIT: 40.4 % (ref 36.0–46.0)
HEMOGLOBIN: 12.9 g/dL (ref 12.0–15.0)
MCH: 29.5 pg (ref 26.0–34.0)
MCHC: 31.9 g/dL (ref 30.0–36.0)
MCV: 92.2 fL (ref 80.0–100.0)
Platelets: 400 10*3/uL (ref 150–400)
RBC: 4.38 MIL/uL (ref 3.87–5.11)
RDW: 12.1 % (ref 11.5–15.5)
WBC: 8.2 10*3/uL (ref 4.0–10.5)
nRBC: 0 % (ref 0.0–0.2)

## 2018-04-11 LAB — LIPASE, BLOOD: LIPASE: 27 U/L (ref 11–51)

## 2018-04-11 LAB — I-STAT BETA HCG BLOOD, ED (MC, WL, AP ONLY): I-stat hCG, quantitative: <5 m[IU]/mL (ref <5)

## 2018-04-11 MED ORDER — IOPAMIDOL (ISOVUE-300) INJECTION 61%
100.0000 mL | Freq: Once | INTRAVENOUS | Status: AC | PRN
  Administered 2018-04-11: 100 mL via INTRAVENOUS

## 2018-04-11 MED ORDER — MORPHINE SULFATE (PF) 4 MG/ML IV SOLN
4.0000 mg | Freq: Once | INTRAVENOUS | Status: AC
  Administered 2018-04-11: 4 mg via INTRAVENOUS
  Filled 2018-04-11: qty 1

## 2018-04-11 MED ORDER — SODIUM CHLORIDE (PF) 0.9 % IJ SOLN
INTRAMUSCULAR | Status: AC
  Filled 2018-04-11: qty 50

## 2018-04-11 MED ORDER — HYDROCODONE-ACETAMINOPHEN 5-325 MG PO TABS: 1.0000 | ORAL_TABLET | Freq: Four times a day (QID) | ORAL | 0 refills | Status: DC | PRN

## 2018-04-11 MED ORDER — IOPAMIDOL (ISOVUE-300) INJECTION 61%
INTRAVENOUS | Status: AC
  Filled 2018-04-11: qty 100

## 2018-04-11 MED ORDER — SODIUM CHLORIDE 0.9 % IV BOLUS
1000.0000 mL | Freq: Once | INTRAVENOUS | Status: AC
  Administered 2018-04-11: 1000 mL via INTRAVENOUS

## 2018-04-11 MED ORDER — ONDANSETRON HCL 4 MG/2ML IJ SOLN
4.0000 mg | Freq: Once | INTRAMUSCULAR | Status: AC
  Administered 2018-04-11: 4 mg via INTRAVENOUS
  Filled 2018-04-11: qty 2

## 2018-04-11 NOTE — ED Provider Notes (Signed)
[facility]-EMERGENCY DEPT Provider Note   CSN: [id number] Arrival date & time: 04/11/18  1003     History   Chief Complaint Chief Complaint  Patient presents with  . Abdominal Pain    HPI Stephanie Lowe is a 32 y.o. female.  Patient is a 32 year old female with no significant past medical history.  She presents with a 3-day history of pain to the right upper quadrant.  This is been constant and is worsening.  She denies any fevers or chills.  She denies any vomiting or diarrhea.  She was seen at urgent care this morning, then referred here for further work-up.  She denies any urinary complaints.  Her last menstrual period was 3 days ago.  The history is provided by the patient.  Abdominal Pain   This is a new problem. Episode onset: 3 days ago. The problem occurs constantly. The problem has been gradually worsening. The pain is associated with an unknown factor. The pain is located in the RUQ and epigastric region. The quality of the pain is cramping. The pain is moderate. Pertinent negatives include fever, hematochezia, nausea, vomiting and dysuria. The symptoms are aggravated by certain positions and palpation. Nothing relieves the symptoms.    Past Medical History:  Diagnosis Date  . Asthma     There are no active problems to display for this patient.   History reviewed. No pertinent surgical history.   OB History   None      Home Medications    Prior to Admission medications   Medication Sig Start Date End Date Taking? Authorizing Provider  cyclobenzaprine (FLEXERIL) 10 MG tablet Take 1 tablet (10 mg total) by mouth 2 (two) times daily as needed for muscle spasms. 08/04/17   Fayrene Helper, PA-C  gabapentin (NEURONTIN) 100 MG capsule Take 1 capsule (100 mg total) by mouth 3 (three) times daily. 08/04/17   Fayrene Helper, PA-C  ibuprofen (ADVIL,MOTRIN) 600 MG tablet Take 1 tablet (600 mg total) by mouth every 8 (eight) hours as needed for moderate  pain. 08/04/17   Fayrene Helper, PA-C    Family History No family history on file.  Social History Social History   Tobacco Use  . Smoking status: Former Smoker    Years: 8.00    Last attempt to quit: 04/12/2015    Years since quitting: 3.0  . Smokeless tobacco: Never Used  Substance Use Topics  . Alcohol use: Yes    Alcohol/week: 7.0 standard drinks    Types: 3 Cans of beer, 4 Standard drinks or equivalent per week  . Drug use: Not Currently     Allergies   Patient has no known allergies.   Review of Systems Review of Systems  Constitutional: Negative for fever.  Gastrointestinal: Positive for abdominal pain. Negative for hematochezia, nausea and vomiting.  Genitourinary: Negative for dysuria.  All other systems reviewed and are negative.    Physical Exam Updated Vital Signs BP (!) 154/109 (BP Location: Left Arm)   Pulse 70   Temp 98.8 F (37.1 C) (Oral)   Resp 16   Ht 5\' 2"  (1.575 m)   Wt 88.5 kg   LMP 04/08/2018   SpO2 100%   BMI 35.67 kg/m   Physical Exam  Constitutional: She is oriented to person, place, and time. She appears well-developed and well-nourished. No distress.  HENT:  Head: Normocephalic and atraumatic.  Neck: Normal range of motion. Neck supple.  Cardiovascular: Normal rate and regular rhythm. Exam reveals no gallop  and no friction rub.  No murmur heard. Pulmonary/Chest: Effort normal and breath sounds normal. No respiratory distress. She has no wheezes.  Abdominal: Soft. Bowel sounds are normal. She exhibits no distension. There is tenderness in the right upper quadrant and epigastric area. There is no rigidity, no rebound, no guarding and no CVA tenderness.  Musculoskeletal: Normal range of motion.  Neurological: She is alert and oriented to person, place, and time.  Skin: Skin is warm and dry. She is not diaphoretic.  Nursing note and vitals reviewed.    ED Treatments / Results  Labs (all labs ordered are listed, but only abnormal  results are displayed) Labs Reviewed  LIPASE, BLOOD  COMPREHENSIVE METABOLIC PANEL  CBC  URINALYSIS, ROUTINE W REFLEX MICROSCOPIC  I-STAT BETA HCG BLOOD, ED ([facility], [facility], [facility] ONLY)    EKG None  Radiology No results found.  Procedures Procedures (including critical care time)  Medications Ordered in ED Medications - No data to display   Initial Impression / Assessment and Plan / ED Course  I have reviewed the triage vital signs and the nursing notes.  Pertinent labs & imaging results that were available during my care of the patient were reviewed by me and considered in my medical decision making (see chart for details).  Patient presents with complaints of right upper quadrant abdominal pain.  Work-up this morning is unremarkable including laboratory studies, ultrasound, and CT scan.  I am uncertain as to the exact etiology of this discomfort, however nothing appears emergent.  At this point, I feel as though she is appropriate for discharge.  She is to follow-up with GI if her symptoms persist.  Final Clinical Impressions(s) / ED Diagnoses   Final diagnoses:  RUQ pain    ED Discharge Orders    None       Geoffery Lyonselo, Caylee Vlachos, MD 04/11/18 1352

## 2018-04-11 NOTE — ED Triage Notes (Signed)
Pt sent from urgent care. Pt states is having RUQ pain since Sunday, radiating to her back. Pt states the last time she ate anything was at 0500. Urgent care concerned for gallstones.

## 2018-04-11 NOTE — Discharge Instructions (Addendum)
Hydrocodone is prescribed as needed for pain.  Follow-up with gastroenterology if your symptoms do not improve in the next 2 to 3 days.  The contact information for [facility] has been provided in this discharge summary for you to call and make these arrangements.

## 2018-04-21 ENCOUNTER — Encounter: Payer: Self-pay | Admitting: Physician Assistant

## 2018-04-21 ENCOUNTER — Ambulatory Visit (INDEPENDENT_AMBULATORY_CARE_PROVIDER_SITE_OTHER): Payer: Managed Care, Other (non HMO) | Admitting: Physician Assistant

## 2018-04-21 VITALS — BP 120/80 | HR 76 | Ht 62.0 in | Wt 196.3 lb

## 2018-04-21 DIAGNOSIS — R1011 Right upper quadrant pain: Secondary | ICD-10-CM

## 2018-04-21 DIAGNOSIS — M546 Pain in thoracic spine: Secondary | ICD-10-CM

## 2018-04-21 MED ORDER — OMEPRAZOLE 20 MG PO CPDR: DELAYED_RELEASE_CAPSULE | ORAL | 1 refills | Status: DC

## 2018-04-21 NOTE — Patient Instructions (Signed)
[facility]. The order for the x-ray is in so you don't have a specific appointment. This is a walk in order.  We sent a prescription for Omeprazole 20 mg to your pharmacy.  Take Aleve 2 tablet by mouth twice daily for 2 days then 2 tablets daily for 1 week.   Take Miralax 17 grams in 8 oz of water daily until bowels back to normal.  Call us on Tuesday, 04-25-18- ask for Amy's nurse, give her a progress report.   Normal BMI (Body Mass Index- based on height and weight) is between 19 and 25. Your BMI today is Body mass index is 35.9 kg/m. Marland Kitchen. Please consider follow up  regarding your BMI with your Primary Care Provider.

## 2018-04-24 ENCOUNTER — Encounter: Payer: Self-pay | Admitting: Physician Assistant

## 2018-04-24 ENCOUNTER — Ambulatory Visit (HOSPITAL_COMMUNITY)
Admission: RE | Admit: 2018-04-24 | Discharge: 2018-04-24 | Disposition: A | Discharge status: Home / Self Care | Payer: Managed Care, Other (non HMO) | Source: Ambulatory Visit | Attending: Physician Assistant | Admitting: Physician Assistant

## 2018-04-24 ENCOUNTER — Ambulatory Visit: Payer: Managed Care, Other (non HMO) | Admitting: Physician Assistant

## 2018-04-24 DIAGNOSIS — R1011 Right upper quadrant pain: Secondary | ICD-10-CM | POA: Insufficient documentation

## 2018-04-24 DIAGNOSIS — M546 Pain in thoracic spine: Secondary | ICD-10-CM | POA: Insufficient documentation

## 2018-04-24 NOTE — Progress Notes (Signed)
Subjective:    Patient ID: Stephanie Lowe, female    DOB: [birth year], 32 y.o.   MRN: [id number]  HPI Stephanie Lowe is a pleasant 32 year old African-American female, new to GI today comes in after recent ER visit on 04/11/2018 where she presented with acute right upper quadrant pain x3 days. She is not had any prior GI evaluation and is otherwise generally healthy.  She underwent upper abdominal ultrasound which showed no gallstones or gallbladder wall thickening, CBD of 1 mm liver within normal limits.  CT of the abdomen and pelvis was then done which showed a few scattered diverticuli in the sigmoid colon, normal-appearing appendix, and a tiny right ovarian cyst. Labs including CBC, c-Met, lipase, beta hCG and UA all unremarkable. Patient says she had fairly acute onset of symptoms on 04/10/2018 when she woke up.  She says the discomfort is been fairly constant since describes it as a burning pressure and sharp type of pain which is aggravated by walking, bending, going over bumps in the car and she also feels that she has had some skin sensitivity in the area of discomfort.  She describes pain in the right upper quadrant radiating around into her back which has not migrated.  There is not been any associated fever or chills She did have a little nausea the first day which has resolved since no vomiting no diarrhea, no change in symptoms with p.o. intake.  She has been constipated over the past week and feels this is because it hurts somewhat to strain. She also mentions that a couple of months ago she saw a small amount of what she thought was blood in her stool x1.  This is not recurred.  -She is not had any known injury, no repetitive movements falls etc.  Review of Systems Pertinent positive and negative review of systems were noted in the above HPI section.  All other review of systems was otherwise negative.  Outpatient Encounter Medications as of 04/21/2018  Medication Sig  .  HYDROcodone-acetaminophen (NORCO) 5-325 MG tablet Take 1-2 tablets by mouth every 6 (six) hours as needed.  Marland Kitchen omeprazole (PRILOSEC) 20 MG capsule Take 1 tablet by mouth twice daily.  . [DISCONTINUED] acetaminophen (TYLENOL) 500 MG tablet Take 1,000 mg by mouth every 8 (eight) hours as needed for mild pain.  . [DISCONTINUED] cyclobenzaprine (FLEXERIL) 10 MG tablet Take 1 tablet (10 mg total) by mouth 2 (two) times daily as needed for muscle spasms. (Patient not taking: Reported on 04/11/2018)  . [DISCONTINUED] gabapentin (NEURONTIN) 100 MG capsule Take 1 capsule (100 mg total) by mouth 3 (three) times daily. (Patient not taking: Reported on 04/11/2018)  . [DISCONTINUED] ibuprofen (ADVIL,MOTRIN) 600 MG tablet Take 1 tablet (600 mg total) by mouth every 8 (eight) hours as needed for moderate pain. (Patient not taking: Reported on 04/11/2018)   No facility-administered encounter medications on file as of 04/21/2018.    No Known Allergies There are no active problems to display for this patient.  Social History   Socioeconomic History  . Marital status: Single    Spouse name: Not on file  . Number of children: Not on file  . Years of education: Not on file  . Highest education level: Not on file  Occupational History  . Not on file  Social Needs  . Financial resource strain: Not on file  . Food insecurity:    Worry: Not on file    Inability: Not on file  . Transportation needs:    Medical:  Not on file    Non-medical: Not on file  Tobacco Use  . Smoking status: Former Smoker    Years: 8.00    Last attempt to quit: 04/12/2015    Years since quitting: 3.0  . Smokeless tobacco: Never Used  Substance and Sexual Activity  . Alcohol use: Yes    Alcohol/week: 7.0 standard drinks    Types: 3 Cans of beer, 4 Standard drinks or equivalent per week  . Drug use: Not Currently  . Sexual activity: Not on file  Lifestyle  . Physical activity:    Days per week: Not on file    Minutes per  session: Not on file  . Stress: Not on file  Relationships  . Social connections:    Talks on phone: Not on file    Gets together: Not on file    Attends religious service: Not on file    Active member of club or organization: Not on file    Attends meetings of clubs or organizations: Not on file    Relationship status: Not on file  . Intimate partner violence:    Fear of current or ex partner: Not on file    Emotionally abused: Not on file    Physically abused: Not on file    Forced sexual activity: Not on file  Other Topics Concern  . Not on file  Social History Narrative  . Not on file    Stephanie Lowe's family history includes Clotting disorder in her mother; Diverticulosis in her mother.      Objective:    Vitals:   04/21/18 1546  BP: 120/80  Pulse: 76    Physical Exam; well-developed young African-American female in no acute distress, pleasant blood pressure 120/80 pulse 76, BMI 35.9.  HEENT; nontraumatic normocephalic EOMI PERRLA sclera anicteric, oral mucosa moist, Cardiovascular; regular rate and rhythm with S1-S2 no murmur rub or gallop, Pulmonary; clear bilaterally, Abdomen ;soft, bowel sounds are present she has some tenderness in the right upper quadrant and around into the right lateral lower ribs and right posterior rib area, there is no costal margin tenderness anteriorly, no palpable mass or hepatosplenomegaly, no rash or skin lesions noted.  Extremities ;no clubbing cyanosis or edema skin warm and dry, Neuropsych; alert and oriented, grossly nonfocal mood and affect appropriate.       Assessment & Plan:   #88 32 year old African-American female with 10-day history of right upper quadrant pain radiating around into the right back.  Patient has obvious tenderness to palpation of the right lateral rib and right posterior rib area on exam and pain is consistent with a musculoskeletal etiology. Rule out muscle strain versus costochondritis.  She does not have any  respiratory symptoms to suggest pleurisy  Both abdominal ultrasound and CT of the abdomen and pelvis were unremarkable which is reassuring.  #2 incidental sigmoid diverticulosis #3 incidental small right ovarian cyst  Plan;  Pt was was reassured that her pain appears to be of musculoskeletal etiology, and that no underlying GI issues were found with recent labs or imaging.  She is advised to use moist heat for 15 to 20 minutes 2-3 times daily symptoms improved. Start a short course of omeprazole 20 mg p.o. twice daily x2 weeks Start naproxen 400 mg p.o. twice daily x3 days then 400 mg once daily x1 week. She will try MiraLAX 17 g in 8 ounces of water over the next few days for her mild constipation which I suspect will resolve as her  discomfort improves. Will obtain chest x-ray with rib details  She is advised to call back in a week if symptoms have not significantly improved. Patient will be established with Dr. Ardis Hughs.  Milton Sagona S Jen Benedict PA-C 04/24/2018   Cc: Berkley Harvey, NP

## 2018-04-25 NOTE — Progress Notes (Signed)
I agree with the above note, plan 

## 2018-04-26 ENCOUNTER — Telehealth: Payer: Self-pay | Admitting: Physician Assistant

## 2018-04-26 NOTE — Telephone Encounter (Signed)
CXR is normal but has not been reviewed. Left patient a message.

## 2018-06-14 ENCOUNTER — Ambulatory Visit (HOSPITAL_COMMUNITY): Payer: Self-pay | Admitting: Licensed Clinical Social Worker

## 2018-06-22 ENCOUNTER — Ambulatory Visit (HOSPITAL_COMMUNITY): Payer: Self-pay | Admitting: Licensed Clinical Social Worker

## 2018-06-28 ENCOUNTER — Ambulatory Visit (HOSPITAL_COMMUNITY): Payer: Self-pay | Admitting: Licensed Clinical Social Worker

## 2019-04-02 LAB — OB RESULTS CONSOLE RPR: RPR: NONREACTIVE

## 2019-04-02 LAB — OB RESULTS CONSOLE HEPATITIS B SURFACE ANTIGEN: Hepatitis B Surface Ag: NEGATIVE

## 2019-04-02 LAB — OB RESULTS CONSOLE HIV ANTIBODY (ROUTINE TESTING): HIV: NONREACTIVE

## 2019-04-02 LAB — OB RESULTS CONSOLE RUBELLA ANTIBODY, IGM: Rubella: IMMUNE

## 2019-04-02 LAB — OB RESULTS CONSOLE GC/CHLAMYDIA
Chlamydia: NEGATIVE
Gonorrhea: NEGATIVE

## 2019-04-18 ENCOUNTER — Other Ambulatory Visit: Payer: Self-pay

## 2019-04-18 DIAGNOSIS — Z20822 Contact with and (suspected) exposure to covid-19: Secondary | ICD-10-CM

## 2019-04-21 LAB — NOVEL CORONAVIRUS, NAA: SARS-CoV-2, NAA: NOT DETECTED

## 2019-05-18 NOTE — L&D Delivery Note (Signed)
Delivery Note Patient admitted for term induction of labor. She received two doses of cytotec then had spontaneous rupture of membranes.  She was started on pitocin.  She progressed along a normal labor curve.  She pushed for two hours.  At 11:12 AM a viable female was delivered via Vaginal, Spontaneous (Presentation:  Occiput anterior    ).  APGAR: 9, 10; weight 3357 gm (7lb 6.4oz) .   Placenta status: Spontaneous, Intact.  Cord: 3 vessels with the following complications: None.  Cord pH: n/a  Anesthesia: Epidural Episiotomy:  None Lacerations: 1st degree and right labial Suture Repair: 3.0 vicryl rapide Est. Blood Loss (mL):  50 mL  Mom to postpartum.  Baby to Couplet care / Skin to Skin.  Laketta Soderberg GEFFEL Makenize Messman 11/16/2019, 11:38 AM

## 2019-10-05 ENCOUNTER — Inpatient Hospital Stay (HOSPITAL_COMMUNITY)
Admission: AD | Admit: 2019-10-05 | Discharge: 2019-10-09 | Disposition: A | Discharge status: Home / Self Care | Payer: Managed Care, Other (non HMO) | Attending: Obstetrics | Admitting: Obstetrics

## 2019-10-05 ENCOUNTER — Other Ambulatory Visit: Payer: Self-pay

## 2019-10-05 ENCOUNTER — Encounter (HOSPITAL_COMMUNITY): Payer: Self-pay | Admitting: Obstetrics

## 2019-10-05 DIAGNOSIS — R5383 Other fatigue: Secondary | ICD-10-CM

## 2019-10-05 DIAGNOSIS — Z79899 Other long term (current) drug therapy: Secondary | ICD-10-CM | POA: Diagnosis not present

## 2019-10-05 DIAGNOSIS — E785 Hyperlipidemia, unspecified: Secondary | ICD-10-CM | POA: Insufficient documentation

## 2019-10-05 DIAGNOSIS — R0602 Shortness of breath: Secondary | ICD-10-CM | POA: Insufficient documentation

## 2019-10-05 DIAGNOSIS — R0981 Nasal congestion: Secondary | ICD-10-CM | POA: Diagnosis not present

## 2019-10-05 DIAGNOSIS — Z3A00 Weeks of gestation of pregnancy not specified: Secondary | ICD-10-CM

## 2019-10-05 DIAGNOSIS — Z3A35 35 weeks gestation of pregnancy: Secondary | ICD-10-CM | POA: Insufficient documentation

## 2019-10-05 DIAGNOSIS — Z20822 Contact with and (suspected) exposure to covid-19: Secondary | ICD-10-CM | POA: Diagnosis not present

## 2019-10-05 DIAGNOSIS — R059 Cough, unspecified: Secondary | ICD-10-CM

## 2019-10-05 DIAGNOSIS — J029 Acute pharyngitis, unspecified: Secondary | ICD-10-CM | POA: Insufficient documentation

## 2019-10-05 DIAGNOSIS — O26893 Other specified pregnancy related conditions, third trimester: Secondary | ICD-10-CM | POA: Diagnosis present

## 2019-10-05 DIAGNOSIS — O26899 Other specified pregnancy related conditions, unspecified trimester: Secondary | ICD-10-CM

## 2019-10-05 DIAGNOSIS — J45909 Unspecified asthma, uncomplicated: Secondary | ICD-10-CM | POA: Diagnosis not present

## 2019-10-05 DIAGNOSIS — R062 Wheezing: Secondary | ICD-10-CM

## 2019-10-05 DIAGNOSIS — R6883 Chills (without fever): Secondary | ICD-10-CM

## 2019-10-05 DIAGNOSIS — Z3A34 34 weeks gestation of pregnancy: Secondary | ICD-10-CM

## 2019-10-05 DIAGNOSIS — Z87891 Personal history of nicotine dependence: Secondary | ICD-10-CM | POA: Insufficient documentation

## 2019-10-05 DIAGNOSIS — R509 Fever, unspecified: Secondary | ICD-10-CM | POA: Insufficient documentation

## 2019-10-05 DIAGNOSIS — R05 Cough: Secondary | ICD-10-CM

## 2019-10-05 NOTE — MAU Note (Deleted)
Pt was taken to ED via WC by NT, pt saw/heard the wait and decided to leave.  Mentioned to take she would just go to [facility]

## 2019-10-05 NOTE — MAU Note (Signed)
Mon night, she was feeling a little sick, stuffy nose, sore throat, and chills.  Thought covid.  Tested on Tues, was neg.  Still dealing with stuffy nose, cough and congestion. Hasn't been able to sleep, tried to sleep sitting up, but is unable. Highest temp got was 99.7. drinking hot tea,  Not taking any meds, doesn't like to take meds.  Has asthma, inhaler not working. First day lost tasteand smell, but only the one day.  Denies any preg issues.

## 2019-10-05 NOTE — MAU Provider Note (Signed)
Chief Complaint: sinus congestion, Sore Throat, Cough, and Chills   First Provider Initiated Contact with Patient 10/05/19 1636      SUBJECTIVE HPI: Stephanie Lowe is a 34 y.o. G1P0 who presents to maternity admissions for SOB, chills, low grade fever, sore throat and wheezing. Symptoms started earlier in the week. She reports she had a negative covid swab on Tuesday.   Past Medical History:  Diagnosis Date  . Asthma   . Hyperlipidemia    Past Surgical History:  Procedure Laterality Date  . none     Social History   Socioeconomic History  . Marital status: Single    Spouse name: Not on file  . Number of children: Not on file  . Years of education: Not on file  . Highest education level: Not on file  Occupational History  . Not on file  Tobacco Use  . Smoking status: Former Smoker    Years: 8.00    Quit date: 04/12/2015    Years since quitting: 4.4  . Smokeless tobacco: Never Used  Substance and Sexual Activity  . Alcohol use: Yes    Alcohol/week: 7.0 standard drinks    Types: 3 Cans of beer, 4 Standard drinks or equivalent per week  . Drug use: Not Currently  . Sexual activity: Not on file  Other Topics Concern  . Not on file  Social History Narrative  . Not on file   Social Determinants of Health   Financial Resource Strain:   . Difficulty of Paying Living Expenses:   Food Insecurity:   . Worried About Programme researcher, broadcasting/film/video in the Last Year:   . Barista in the Last Year:   Transportation Needs:   . Freight forwarder (Medical):   Marland Kitchen Lack of Transportation (Non-Medical):   Physical Activity:   . Days of Exercise per Week:   . Minutes of Exercise per Session:   Stress:   . Feeling of Stress :   Social Connections:   . Frequency of Communication with Friends and Family:   . Frequency of Social Gatherings with Friends and Family:   . Attends Religious Services:   . Active Member of Clubs or Organizations:   . Attends Banker  Meetings:   Marland Kitchen Marital Status:   Intimate Partner Violence:   . Fear of Current or Ex-Partner:   . Emotionally Abused:   Marland Kitchen Physically Abused:   . Sexually Abused:    No current facility-administered medications on file prior to encounter.   Current Outpatient Medications on File Prior to Encounter  Medication Sig Dispense Refill  . HYDROcodone-acetaminophen (NORCO) 5-325 MG tablet Take 1-2 tablets by mouth every 6 (six) hours as needed. 10 tablet 0  . omeprazole (PRILOSEC) 20 MG capsule Take 1 tablet by mouth twice daily. 60 capsule 1   No Known Allergies  ROS:  Review of Systems  Constitutional: Positive for chills and fatigue. Negative for fever.  HENT: Positive for congestion and sore throat.   Respiratory: Positive for cough, shortness of breath and wheezing.   Cardiovascular: Negative for chest pain.  Gastrointestinal: Negative for abdominal pain.  Genitourinary: Negative for vaginal bleeding.   I have reviewed patient's Past Medical Hx, Surgical Hx, Family Hx, Social Hx, medications and allergies.   Physical Exam   Patient Vitals for the past 24 hrs:  BP Temp Temp src Pulse SpO2 Height Weight  10/05/19 1627 125/89 98.4 F (36.9 C) Oral (!) 102 99 % 5\' 2"  (1.575  m) 100.6 kg   Physical Exam  Constitutional: She appears well-developed and well-nourished. No distress.    MDM  + fetal hear tones via doppler  Spoke to Dr. Peyton Bottoms in the [facility] ED. Ok to transfer patient for further evaluation of her complaints.    ASSESSMENT MSE Complete  PLAN  Transfer to [facility] ED.   Lezlie Lye, NP 10/05/2019 4:49 PM

## 2019-10-18 LAB — OB RESULTS CONSOLE GBS: GBS: POSITIVE

## 2019-10-22 NOTE — MAU Provider Note (Signed)
Chief Complaint: sinus congestion, Sore Throat, Cough, and Chills   First Provider Initiated Contact with Patient 10/05/19 1636      SUBJECTIVE HPI: Stephanie Lowe is a 34 y.o. G1P0 @ 35w gestation  who presents to maternity admissions for SOB, chills, low grade fever, sore throat and wheezing. Symptoms started earlier in the week. She reports she had a negative covid swab on Tuesday.   Past Medical History:  Diagnosis Date  . Asthma   . Hyperlipidemia    Past Surgical History:  Procedure Laterality Date  . none     Social History   Socioeconomic History  . Marital status: Single    Spouse name: Not on file  . Number of children: Not on file  . Years of education: Not on file  . Highest education level: Not on file  Occupational History  . Not on file  Tobacco Use  . Smoking status: Former Smoker    Years: 8.00    Quit date: 04/12/2015    Years since quitting: 4.5  . Smokeless tobacco: Never Used  Substance and Sexual Activity  . Alcohol use: Yes    Alcohol/week: 7.0 standard drinks    Types: 3 Cans of beer, 4 Standard drinks or equivalent per week  . Drug use: Not Currently  . Sexual activity: Not on file  Other Topics Concern  . Not on file  Social History Narrative  . Not on file   Social Determinants of Health   Financial Resource Strain:   . Difficulty of Paying Living Expenses:   Food Insecurity:   . Worried About Programme researcher, broadcasting/film/video in the Last Year:   . Barista in the Last Year:   Transportation Needs:   . Freight forwarder (Medical):   Marland Kitchen Lack of Transportation (Non-Medical):   Physical Activity:   . Days of Exercise per Week:   . Minutes of Exercise per Session:   Stress:   . Feeling of Stress :   Social Connections:   . Frequency of Communication with Friends and Family:   . Frequency of Social Gatherings with Friends and Family:   . Attends Religious Services:   . Active Member of Clubs or Organizations:   . Attends Tax inspector Meetings:   Marland Kitchen Marital Status:   Intimate Partner Violence:   . Fear of Current or Ex-Partner:   . Emotionally Abused:   Marland Kitchen Physically Abused:   . Sexually Abused:    No current facility-administered medications on file prior to encounter.   Current Outpatient Medications on File Prior to Encounter  Medication Sig Dispense Refill  . omeprazole (PRILOSEC) 20 MG capsule Take 1 tablet by mouth twice daily. 60 capsule 1   No Known Allergies  ROS:  Review of Systems  Constitutional: Positive for chills and fatigue. Negative for fever.  HENT: Positive for congestion and sore throat.   Respiratory: Positive for cough, shortness of breath and wheezing.   Cardiovascular: Negative for chest pain.  Gastrointestinal: Negative for abdominal pain.  Genitourinary: Negative for vaginal bleeding.   I have reviewed patient's Past Medical Hx, Surgical Hx, Family Hx, Social Hx, medications and allergies.   Physical Exam   No data found. Physical Exam  Constitutional: She appears well-developed and well-nourished. No distress.    MDM  + fetal hear tones via doppler  Spoke to Dr. Jerelyn Charles in the [facility] ED. Ok to transfer patient for further evaluation of her complaints.    ASSESSMENT MSE  Complete  PLAN  Transfer to [facility] ED.   Noni Saupe I, NP 10/22/2019 1:16 PM

## 2019-11-08 ENCOUNTER — Other Ambulatory Visit: Payer: Self-pay | Admitting: Obstetrics & Gynecology

## 2019-11-12 ENCOUNTER — Telehealth (HOSPITAL_COMMUNITY): Payer: Self-pay | Admitting: *Deleted

## 2019-11-12 NOTE — Telephone Encounter (Signed)
Preadmission screen  

## 2019-11-13 ENCOUNTER — Other Ambulatory Visit (HOSPITAL_COMMUNITY)
Admission: RE | Admit: 2019-11-13 | Discharge: 2019-11-13 | Disposition: A | Discharge status: Home / Self Care | Payer: Managed Care, Other (non HMO) | Source: Ambulatory Visit | Attending: Obstetrics and Gynecology | Admitting: Obstetrics and Gynecology

## 2019-11-13 ENCOUNTER — Telehealth (HOSPITAL_COMMUNITY): Payer: Self-pay | Admitting: *Deleted

## 2019-11-13 LAB — SARS CORONAVIRUS 2 (TAT 6-24 HRS): SARS Coronavirus 2: NEGATIVE

## 2019-11-13 NOTE — Telephone Encounter (Signed)
Preadmission screen  

## 2019-11-15 ENCOUNTER — Inpatient Hospital Stay (HOSPITAL_COMMUNITY): Payer: Managed Care, Other (non HMO)

## 2019-11-15 ENCOUNTER — Encounter (HOSPITAL_COMMUNITY): Payer: Self-pay | Admitting: Obstetrics and Gynecology

## 2019-11-15 ENCOUNTER — Other Ambulatory Visit: Payer: Self-pay

## 2019-11-15 ENCOUNTER — Inpatient Hospital Stay (HOSPITAL_COMMUNITY)
Admission: RE | Admit: 2019-11-15 | Discharge: 2019-11-17 | DRG: 807 | Disposition: A | Discharge status: Home / Self Care | Payer: Managed Care, Other (non HMO) | Attending: Obstetrics | Admitting: Obstetrics

## 2019-11-15 DIAGNOSIS — O99824 Streptococcus B carrier state complicating childbirth: Principal | ICD-10-CM | POA: Diagnosis present

## 2019-11-15 DIAGNOSIS — Z20822 Contact with and (suspected) exposure to covid-19: Secondary | ICD-10-CM | POA: Diagnosis present

## 2019-11-15 DIAGNOSIS — O26893 Other specified pregnancy related conditions, third trimester: Secondary | ICD-10-CM | POA: Diagnosis present

## 2019-11-15 DIAGNOSIS — Z3490 Encounter for supervision of normal pregnancy, unspecified, unspecified trimester: Secondary | ICD-10-CM

## 2019-11-15 DIAGNOSIS — Z3A39 39 weeks gestation of pregnancy: Secondary | ICD-10-CM

## 2019-11-15 DIAGNOSIS — Z87891 Personal history of nicotine dependence: Secondary | ICD-10-CM

## 2019-11-15 DIAGNOSIS — O99214 Obesity complicating childbirth: Secondary | ICD-10-CM | POA: Diagnosis present

## 2019-11-15 LAB — CBC
HCT: 35.7 % — ABNORMAL LOW (ref 36.0–46.0)
Hemoglobin: 11.2 g/dL — ABNORMAL LOW (ref 12.0–15.0)
MCH: 28.8 pg (ref 26.0–34.0)
MCHC: 31.4 g/dL (ref 30.0–36.0)
MCV: 91.8 fL (ref 80.0–100.0)
Platelets: 267 10*3/uL (ref 150–400)
RBC: 3.89 MIL/uL (ref 3.87–5.11)
RDW: 14.6 % (ref 11.5–15.5)
WBC: 10.4 10*3/uL (ref 4.0–10.5)
nRBC: 0 % (ref 0.0–0.2)

## 2019-11-15 LAB — TYPE AND SCREEN
ABO/RH(D): A POS
Antibody Screen: NEGATIVE

## 2019-11-15 LAB — ABO/RH: ABO/RH(D): A POS

## 2019-11-15 MED ORDER — FENTANYL CITRATE (PF) 100 MCG/2ML IJ SOLN: 50.0000 ug | INTRAMUSCULAR | Status: DC | PRN

## 2019-11-15 MED ORDER — DIPHENHYDRAMINE HCL 50 MG/ML IJ SOLN: 12.5000 mg | INTRAMUSCULAR | Status: DC | PRN

## 2019-11-15 MED ORDER — LACTATED RINGERS IV SOLN
500.0000 mL | Freq: Once | INTRAVENOUS | Status: AC
  Administered 2019-11-16: 500 mL via INTRAVENOUS

## 2019-11-15 MED ORDER — SOD CITRATE-CITRIC ACID 500-334 MG/5ML PO SOLN: 30.0000 mL | ORAL | Status: DC | PRN

## 2019-11-15 MED ORDER — OXYTOCIN BOLUS FROM INFUSION
333.0000 mL | Freq: Once | INTRAVENOUS | Status: AC
  Administered 2019-11-16: 333 mL via INTRAVENOUS

## 2019-11-15 MED ORDER — ONDANSETRON HCL 4 MG/2ML IJ SOLN: 4.0000 mg | Freq: Four times a day (QID) | INTRAMUSCULAR | Status: DC | PRN

## 2019-11-15 MED ORDER — PHENYLEPHRINE 40 MCG/ML (10ML) SYRINGE FOR IV PUSH (FOR BLOOD PRESSURE SUPPORT): 80.0000 ug | PREFILLED_SYRINGE | INTRAVENOUS | Status: DC | PRN

## 2019-11-15 MED ORDER — PENICILLIN G POT IN DEXTROSE 60000 UNIT/ML IV SOLN
3.0000 10*6.[IU] | INTRAVENOUS | Status: DC
  Administered 2019-11-15 – 2019-11-16 (×3): 3 10*6.[IU] via INTRAVENOUS
  Filled 2019-11-15 (×3): qty 50

## 2019-11-15 MED ORDER — FENTANYL-BUPIVACAINE-NACL 0.5-0.125-0.9 MG/250ML-% EP SOLN
12.0000 mL/h | EPIDURAL | Status: DC | PRN
  Filled 2019-11-15: qty 250

## 2019-11-15 MED ORDER — MISOPROSTOL 25 MCG QUARTER TABLET
25.0000 ug | ORAL_TABLET | ORAL | Status: DC | PRN
  Administered 2019-11-15 (×2): 25 ug via VAGINAL
  Filled 2019-11-15 (×2): qty 1

## 2019-11-15 MED ORDER — PHENYLEPHRINE 40 MCG/ML (10ML) SYRINGE FOR IV PUSH (FOR BLOOD PRESSURE SUPPORT)
80.0000 ug | PREFILLED_SYRINGE | INTRAVENOUS | Status: DC | PRN
  Filled 2019-11-15: qty 10

## 2019-11-15 MED ORDER — OXYTOCIN-SODIUM CHLORIDE 30-0.9 UT/500ML-% IV SOLN
1.0000 m[IU]/min | INTRAVENOUS | Status: DC
  Administered 2019-11-15 – 2019-11-16 (×2): 2 m[IU]/min via INTRAVENOUS

## 2019-11-15 MED ORDER — DIPHENHYDRAMINE HCL 25 MG PO CAPS
25.0000 mg | ORAL_CAPSULE | Freq: Once | ORAL | Status: AC
  Administered 2019-11-15: 25 mg via ORAL
  Filled 2019-11-15: qty 1

## 2019-11-15 MED ORDER — ACETAMINOPHEN 325 MG PO TABS: 650.0000 mg | ORAL_TABLET | ORAL | Status: DC | PRN

## 2019-11-15 MED ORDER — OXYTOCIN-SODIUM CHLORIDE 30-0.9 UT/500ML-% IV SOLN
2.5000 [IU]/h | INTRAVENOUS | Status: DC
  Filled 2019-11-15: qty 500

## 2019-11-15 MED ORDER — EPHEDRINE 5 MG/ML INJ: 10.0000 mg | INTRAVENOUS | Status: DC | PRN

## 2019-11-15 MED ORDER — LACTATED RINGERS IV SOLN
500.0000 mL | INTRAVENOUS | Status: DC | PRN
  Administered 2019-11-15: 1000 mL via INTRAVENOUS

## 2019-11-15 MED ORDER — TERBUTALINE SULFATE 1 MG/ML IJ SOLN: 0.2500 mg | Freq: Once | INTRAMUSCULAR | Status: DC | PRN

## 2019-11-15 MED ORDER — LIDOCAINE HCL (PF) 1 % IJ SOLN: 30.0000 mL | INTRAMUSCULAR | Status: DC | PRN

## 2019-11-15 MED ORDER — LACTATED RINGERS IV SOLN: INTRAVENOUS | Status: DC

## 2019-11-15 MED ORDER — SODIUM CHLORIDE 0.9 % IV SOLN
5.0000 10*6.[IU] | Freq: Once | INTRAVENOUS | Status: AC
  Administered 2019-11-15: 5 10*6.[IU] via INTRAVENOUS
  Filled 2019-11-15: qty 5

## 2019-11-15 NOTE — H&P (Signed)
Stephanie Lowe is a 34 y.o. female G2P0010 [redacted]w[redacted]d presenting for elective IOL. She reports no LOF, VB, Contractions. Normal FM.  Pregnancy c/b: 1. Asthma: no meds 2. H/o Migraines: no meds  OB History     Gravida  2   Para      Term      Preterm      AB  1   Living         SAB      TAB      Ectopic      Multiple      Live Births             Past Medical History:  Diagnosis Date   Asthma    Hyperlipidemia    Past Surgical History:  Procedure Laterality Date   none     Family History: family history includes Clotting disorder in her mother; Diverticulosis in her mother. Social History:  reports that she quit smoking about 4 years ago. She quit after 8.00 years of use. She has never used smokeless tobacco. She reports current alcohol use of about 7.0 standard drinks of alcohol per week. She reports previous drug use.    Maternal Diabetes: No Genetic Screening: Declined Maternal Ultrasounds/Referrals: Normal anatomy, placenta right lateral Fetal Ultrasounds or other Referrals:  None Maternal Substance Abuse:  No Significant Maternal Medications:  None Significant Maternal Lab Results:  Group B Strep positive Other Comments:  None  Review of Systems Per HPI Exam Physical Exam  SVE 0.5/50/-2, Vertex Blood pressure 135/85, pulse (!) 101, temperature 98.6 F (37 C), temperature source Oral, resp. rate 16. NAD, resting comfortably Gravid abdomen Fetal testing: FHR 160, +accels, no decels, Cat 1. Toco quiet Prenatal labs: ABO, Rh:  --/--/PENDING (07/01 1052) Antibody: PENDING (07/01 1052) Rubella: Immune (11/16 0000) RPR: Nonreactive (11/16 0000)  HBsAg: Negative (11/16 0000)  HIV: Non-reactive (11/16 0000)  GBS: Positive/-- (06/03 0000)   Assessment/Plan: Stephanie Lowe 33 y.o. G2P0010 at [redacted]w[redacted]d here for eIOL 1. IOL: On admit SVE 0.5/50/-3, attempted foley balloon placement but unable to pass internal os, plan for cytotec for ripening followed  by pitocin/AROM prn. Considering epidural. GBS+ on PCN.   Stephanie Lowe K Taam-Akelman 11/15/2019, 12:05 PM

## 2019-11-15 NOTE — Progress Notes (Signed)
OBGYN Note Patient is comfortable SVE 1/50/-2, soft, cytotec#2 placed FHR 140, Cat 1 Toco quiet -Cont cytotec #3 for cervical ripening prn then pit/arom Abbegail Matuska K Taam-Akelman 11/15/19 3:33 PM

## 2019-11-16 ENCOUNTER — Inpatient Hospital Stay (HOSPITAL_COMMUNITY): Payer: Managed Care, Other (non HMO) | Admitting: Anesthesiology

## 2019-11-16 ENCOUNTER — Encounter (HOSPITAL_COMMUNITY): Payer: Self-pay | Admitting: Obstetrics & Gynecology

## 2019-11-16 LAB — RPR: RPR Ser Ql: NONREACTIVE

## 2019-11-16 MED ORDER — LIDOCAINE HCL (PF) 1 % IJ SOLN
INTRAMUSCULAR | Status: DC | PRN
  Administered 2019-11-16: 10 mL via EPIDURAL

## 2019-11-16 MED ORDER — DIBUCAINE (PERIANAL) 1 % EX OINT: 1.0000 "application " | TOPICAL_OINTMENT | CUTANEOUS | Status: DC | PRN

## 2019-11-16 MED ORDER — SENNOSIDES-DOCUSATE SODIUM 8.6-50 MG PO TABS
2.0000 | ORAL_TABLET | ORAL | Status: DC
  Administered 2019-11-16: 2 via ORAL
  Filled 2019-11-16: qty 2

## 2019-11-16 MED ORDER — IBUPROFEN 600 MG PO TABS
600.0000 mg | ORAL_TABLET | Freq: Four times a day (QID) | ORAL | Status: DC
  Administered 2019-11-16 – 2019-11-17 (×6): 600 mg via ORAL
  Filled 2019-11-16 (×6): qty 1

## 2019-11-16 MED ORDER — BENZOCAINE-MENTHOL 20-0.5 % EX AERO
1.0000 "application " | INHALATION_SPRAY | CUTANEOUS | Status: DC | PRN
  Administered 2019-11-16: 1 via TOPICAL
  Filled 2019-11-16: qty 56

## 2019-11-16 MED ORDER — OXYCODONE HCL 5 MG PO TABS: 10.0000 mg | ORAL_TABLET | ORAL | Status: DC | PRN

## 2019-11-16 MED ORDER — ONDANSETRON HCL 4 MG PO TABS: 4.0000 mg | ORAL_TABLET | ORAL | Status: DC | PRN

## 2019-11-16 MED ORDER — WITCH HAZEL-GLYCERIN EX PADS: 1.0000 "application " | MEDICATED_PAD | CUTANEOUS | Status: DC | PRN

## 2019-11-16 MED ORDER — ONDANSETRON HCL 4 MG/2ML IJ SOLN: 4.0000 mg | INTRAMUSCULAR | Status: DC | PRN

## 2019-11-16 MED ORDER — OXYCODONE HCL 5 MG PO TABS: 5.0000 mg | ORAL_TABLET | ORAL | Status: DC | PRN

## 2019-11-16 MED ORDER — COCONUT OIL OIL: 1.0000 "application " | TOPICAL_OIL | Status: DC | PRN

## 2019-11-16 MED ORDER — DIPHENHYDRAMINE HCL 25 MG PO CAPS: 25.0000 mg | ORAL_CAPSULE | Freq: Four times a day (QID) | ORAL | Status: DC | PRN

## 2019-11-16 MED ORDER — ACETAMINOPHEN 325 MG PO TABS: 650.0000 mg | ORAL_TABLET | ORAL | Status: DC | PRN

## 2019-11-16 MED ORDER — SODIUM CHLORIDE (PF) 0.9 % IJ SOLN
INTRAMUSCULAR | Status: DC | PRN
  Administered 2019-11-16: 12 mL/h via EPIDURAL

## 2019-11-16 MED ORDER — SIMETHICONE 80 MG PO CHEW: 80.0000 mg | CHEWABLE_TABLET | ORAL | Status: DC | PRN

## 2019-11-16 MED ORDER — TETANUS-DIPHTH-ACELL PERTUSSIS 5-2.5-18.5 LF-MCG/0.5 IM SUSP: 0.5000 mL | Freq: Once | INTRAMUSCULAR | Status: DC

## 2019-11-16 MED ORDER — PRENATAL MULTIVITAMIN CH
1.0000 | ORAL_TABLET | Freq: Every day | ORAL | Status: DC
  Administered 2019-11-17: 1 via ORAL
  Filled 2019-11-16: qty 1

## 2019-11-16 NOTE — Anesthesia Preprocedure Evaluation (Signed)
Anesthesia Evaluation  Patient identified by MRN, date of birth, ID band Patient awake    Reviewed: Allergy & Precautions, H&P , NPO status , Patient's Chart, lab work & pertinent test results  History of Anesthesia Complications Negative for: history of anesthetic complications  Airway Mallampati: II  TM Distance: >3 FB Neck ROM: full    Dental no notable dental hx.    Pulmonary asthma , former smoker,    Pulmonary exam normal        Cardiovascular negative cardio ROS Normal cardiovascular exam Rhythm:regular Rate:Normal     Neuro/Psych negative neurological ROS  negative psych ROS   GI/Hepatic negative GI ROS, Neg liver ROS,   Endo/Other  Morbid obesity  Renal/GU      Musculoskeletal   Abdominal   Peds  Hematology negative hematology ROS (+)   Anesthesia Other Findings   Reproductive/Obstetrics (+) Pregnancy                             Anesthesia Physical Anesthesia Plan  ASA: III  Anesthesia Plan: Epidural   Post-op Pain Management:    Induction:   PONV Risk Score and Plan:   Airway Management Planned:   Additional Equipment:   Intra-op Plan:   Post-operative Plan:   Informed Consent: I have reviewed the patients History and Physical, chart, labs and discussed the procedure including the risks, benefits and alternatives for the proposed anesthesia with the patient or authorized representative who has indicated his/her understanding and acceptance.       Plan Discussed with:   Anesthesia Plan Comments:         Anesthesia Quick Evaluation

## 2019-11-16 NOTE — Lactation Note (Signed)
This note was copied from a baby's chart. Lactation Consultation Note  Patient Name: Stephanie Lowe [id number] Date: 11/16/2019  Initial visit attempted at 2 hours of life, but one of the NANs (Dava, RN) was in room. Breastfeeding brochure was left in room. LC to return.  Lurline Hare [facility] 11/16/2019, 1:27 PM

## 2019-11-16 NOTE — Anesthesia Procedure Notes (Signed)
Epidural Patient location during procedure: OB Start time: 11/16/2019 2:16 AM End time: 11/16/2019 2:27 AM  Staffing Anesthesiologist: Lucretia Kern, MD Performed: anesthesiologist   Preanesthetic Checklist Completed: patient identified, IV checked, risks and benefits discussed, monitors and equipment checked, pre-op evaluation and timeout performed  Epidural Patient position: sitting Prep: DuraPrep Patient monitoring: heart rate, continuous pulse ox and blood pressure Approach: midline Location: L3-L4 Injection technique: LOR air  Needle:  Needle type: Tuohy  Needle gauge: 17 G Needle length: 9 cm Needle insertion depth: 7 cm Catheter type: closed end flexible Catheter size: 19 Gauge Catheter at skin depth: 12 cm Test dose: negative  Assessment Events: blood not aspirated, injection not painful, no injection resistance, no paresthesia and negative IV test  Additional Notes Reason for block:procedure for pain

## 2019-11-16 NOTE — Lactation Note (Addendum)
This note was copied from a baby's chart. Lactation Consultation Note  Patient Name: Stephanie Lowe [id number] Date: 11/16/2019 Reason for consult: Initial assessment  Initial visit at 2 hours of life. Mom is a P1 who reported + breast changes w/pregnancy. Mom had recently latched infant on her own. Infant was suckling at the breast & Mom was comfortable with latch. I did make some recommendations to Mom for the next latch & I taught Mom the sound of swallows.   When infant came off nipple, Mom's nipple was rounded. Infant tried to relatch, but then soon fell asleep on Mom's chest. Frequency of feedings reviewed. Mom has no questions at this time.    Mom was made aware of O/P services, breastfeeding support groups, and our phone # for post-discharge questions.    Lurline Hare [facility] 11/16/2019, 1:50 PM

## 2019-11-16 NOTE — Anesthesia Postprocedure Evaluation (Signed)
Anesthesia Post Note  Patient: Stephanie Lowe  Procedure(s) Performed: AN AD HOC LABOR EPIDURAL     Anesthesia Type: Epidural   No complications documented.  Last Vitals:  Vitals:   11/16/19 1310 11/16/19 1420  BP: 130/80 127/85  Pulse: 81 93  Resp: 17 18  Temp: 36.7 C 36.7 C  SpO2: 99%     Last Pain:  Vitals:   11/16/19 1500  TempSrc:   PainSc: 0-No pain   Pain Goal:                   Rica Records

## 2019-11-17 LAB — CBC
HCT: 34.1 % — ABNORMAL LOW (ref 36.0–46.0)
Hemoglobin: 10.7 g/dL — ABNORMAL LOW (ref 12.0–15.0)
MCH: 29.2 pg (ref 26.0–34.0)
MCHC: 31.4 g/dL (ref 30.0–36.0)
MCV: 92.9 fL (ref 80.0–100.0)
Platelets: 238 10*3/uL (ref 150–400)
RBC: 3.67 MIL/uL — ABNORMAL LOW (ref 3.87–5.11)
RDW: 14.7 % (ref 11.5–15.5)
WBC: 14.1 10*3/uL — ABNORMAL HIGH (ref 4.0–10.5)
nRBC: 0 % (ref 0.0–0.2)

## 2019-11-17 MED ORDER — IBUPROFEN 600 MG PO TABS: 600.0000 mg | ORAL_TABLET | Freq: Four times a day (QID) | ORAL | 0 refills | Status: DC

## 2019-11-17 NOTE — Progress Notes (Signed)
Patient is doing well.  She is ambulating, voiding, tolerating PO.  Pain control is good.  Lochia is appropriate.  She desires discharge to home.   Vitals:   11/16/19 1420 11/16/19 1759 11/16/19 2300 11/17/19 0250  BP: 127/85 135/79 (!) 134/97 131/70  Pulse: 93 87 93 98  Resp: 18 17 18 19   Temp: 98.1 F (36.7 C)  98.4 F (36.9 C) 98.1 F (36.7 C)  TempSrc: Oral  Oral Oral  SpO2:   99% 99%  Weight:      Height:        NAD Fundus firm Ext: no edema  Lab Results  Component Value Date   WBC 14.1 (H) 11/17/2019   HGB 10.7 (L) 11/17/2019   HCT 34.1 (L) 11/17/2019   MCV 92.9 11/17/2019   PLT 238 11/17/2019    --/--/A POS, A POS Performed at [facility], [address], [place], [place]  (07/01 1052)/RImmune  A/P 34 y.o. G2P1011 PPD#1. Routine care.   Meeting all goals--will discharge to home today if baby is meeting goals for discharge.  [facility] GEFFEL [facility]

## 2019-11-17 NOTE — Discharge Summary (Signed)
Postpartum Discharge Summary    Patient Name: Stephanie Lowe DOB: Mar 24, 1986 MRN: [id number]  Date of admission: 11/15/2019 Delivery date:11/16/2019  Delivering provider: Marlow Baars  Date of discharge: 11/17/2019  Admitting diagnosis: [redacted] weeks gestation of pregnancy [Z3A.39] Intrauterine pregnancy: [redacted]w[redacted]d     Secondary diagnosis:  Active Problems:   Term pregnancy   [redacted] weeks gestation of pregnancy   Discharge diagnosis: Term Pregnancy Delivered                                              Post partum procedures:None Augmentation: AROM, Pitocin and Cytotec Complications: None  Hospital course: Induction of Labor With Vaginal Delivery   34 y.o. yo G2P1011 at [redacted]w[redacted]d was admitted to the hospital 11/15/2019 for induction of labor.  Indication for induction: Elective.  Patient had an uncomplicated labor course as follows: Membrane Rupture Time/Date: 12:21 AM ,11/16/2019   Delivery Method:Vaginal, Spontaneous  Episiotomy:   Lacerations:  1st degree  Details of delivery can be found in separate delivery note.  Patient had a routine postpartum course. Patient is discharged home 11/17/19.  Newborn Data: Birth date:11/16/2019  Birth time:11:12 AM  Gender:Female  Living status:Living  Apgars:9 ,10  Weight:3357 g    Physical exam  Vitals:   11/16/19 1420 11/16/19 1759 11/16/19 2300 11/17/19 0250  BP: 127/85 135/79 (!) 134/97 131/70  Pulse: 93 87 93 98  Resp: 18 17 18 19   Temp: 98.1 F (36.7 C)  98.4 F (36.9 C) 98.1 F (36.7 C)  TempSrc: Oral  Oral Oral  SpO2:   99% 99%  Weight:      Height:       General: alert, cooperative and no distress Lochia: appropriate Uterine Fundus: firm DVT Evaluation: No evidence of DVT seen on physical exam. Labs: Lab Results  Component Value Date   WBC 14.1 (H) 11/17/2019   HGB 10.7 (L) 11/17/2019   HCT 34.1 (L) 11/17/2019   MCV 92.9 11/17/2019   PLT 238 11/17/2019   CMP Latest Ref Rng & Units 04/11/2018  Glucose 70 - 99 mg/dL 93   BUN 6 - 20 mg/dL 10  Creatinine 04/13/2018 - 2.53 mg/dL 6.64  Sodium 4.03 - 474 mmol/L 139  Potassium 3.5 - 5.1 mmol/L 3.6  Chloride 98 - 111 mmol/L 106  CO2 22 - 32 mmol/L 24  Calcium 8.9 - 10.3 mg/dL 259)  Total Protein 6.5 - 8.1 g/dL 7.9  Total Bilirubin 0.3 - 1.2 mg/dL 5.6(L)  Alkaline Phos 38 - 126 U/L 47  AST 15 - 41 U/L 16  ALT 0 - 44 U/L 21   Edinburgh Score: Edinburgh Postnatal Depression Scale Screening Tool 11/16/2019  I have been able to laugh and see the funny side of things. 0  I have looked forward with enjoyment to things. 0  I have blamed myself unnecessarily when things went wrong. 1  I have been anxious or worried for no good reason. 0  I have felt scared or panicky for no good reason. 1  Things have been getting on top of me. 1  I have been so unhappy that I have had difficulty sleeping. 1  I have felt sad or miserable. 1  I have been so unhappy that I have been crying. 1  The thought of harming myself has occurred to me. 0  Edinburgh Postnatal Depression Scale Total 6  After visit meds:  Allergies as of 11/17/2019   No Known Allergies     Medication List    TAKE these medications   acetaminophen 325 MG tablet Commonly known as: TYLENOL Take 650 mg by mouth every 6 (six) hours as needed (for pain.).   ibuprofen 600 MG tablet Commonly known as: ADVIL Take 1 tablet (600 mg total) by mouth every 6 (six) hours. Start taking on: November 18, 2019   multivitamin-prenatal 27-0.8 MG Tabs tablet Take 1 tablet by mouth daily.   ProAir HFA 108 (90 Base) MCG/ACT inhaler Generic drug: albuterol Inhale 1-2 puffs into the lungs every 6 (six) hours as needed for wheezing or shortness of breath.   ROLAIDS PO Take 1-2 tablets by mouth 3 (three) times daily as needed (hearburn/indigestion.).        Discharge home in stable condition Infant Feeding: Breast Infant Disposition:home with mother Discharge instruction: per After Visit Summary and Postpartum  booklet. Activity: Advance as tolerated. Pelvic rest for 6 weeks.  Diet: routine diet Postpartum Appointment:4 weeks      11/17/2019 [facility] GEFFEL Chestine Spore, MD

## 2019-11-17 NOTE — Progress Notes (Signed)
AVS printed and discharged instructions given to parents. Pt informed to call for follow up appointment. All questions answered and pt verbalized understanding.

## 2019-11-17 NOTE — Lactation Note (Signed)
This note was copied from a baby's chart. Lactation Consultation Note  Patient Name: Stephanie Lowe Date: 11/17/2019   Infant is 26 hrs old. Infant has had 2 stools and 0 voids since birth.   Infant is nursing very well; swallows are spontaneous & frequent (verified by cervical auscultation). Mom also taught how to do breast compressions, if needed, to increase frequency of swallowing.  Mom is comfortable with latch; infant latches with ease. Mom is confident with handling baby.   NP was given an update.  Lurline Hare [facility] 11/17/2019, 2:06 PM

## 2020-12-16 ENCOUNTER — Ambulatory Visit: Payer: Managed Care, Other (non HMO) | Attending: Nurse Practitioner | Admitting: Nurse Practitioner

## 2020-12-16 ENCOUNTER — Ambulatory Visit (HOSPITAL_COMMUNITY)
Admission: RE | Admit: 2020-12-16 | Discharge: 2020-12-16 | Disposition: A | Discharge status: Home / Self Care | Payer: Managed Care, Other (non HMO) | Source: Ambulatory Visit | Attending: Nurse Practitioner | Admitting: Nurse Practitioner

## 2020-12-16 ENCOUNTER — Other Ambulatory Visit: Payer: Self-pay

## 2020-12-16 ENCOUNTER — Encounter: Payer: Self-pay | Admitting: Nurse Practitioner

## 2020-12-16 VITALS — BP 132/91 | HR 67 | Temp 97.9°F | Resp 18

## 2020-12-16 DIAGNOSIS — R059 Cough, unspecified: Secondary | ICD-10-CM | POA: Insufficient documentation

## 2020-12-16 DIAGNOSIS — Z8616 Personal history of COVID-19: Secondary | ICD-10-CM | POA: Diagnosis not present

## 2020-12-16 DIAGNOSIS — R0602 Shortness of breath: Secondary | ICD-10-CM | POA: Insufficient documentation

## 2020-12-16 MED ORDER — BUDESONIDE-FORMOTEROL FUMARATE 80-4.5 MCG/ACT IN AERO
2.0000 | INHALATION_SPRAY | Freq: Two times a day (BID) | RESPIRATORY_TRACT | 3 refills | Status: DC
Start: 2020-12-16 — End: 2023-08-22

## 2020-12-16 MED ORDER — PREDNISONE 10 MG PO TABS
ORAL_TABLET | ORAL | 0 refills | Status: DC
Start: 2020-12-16 — End: 2023-08-22

## 2020-12-16 NOTE — Progress Notes (Signed)
@Patient  ID: , female    DOB: Sep 02, 1985, 35 y.o.   MRN: [id number]  Chief Complaint  Patient presents with   Cough     Referring provider: [id number], MD   HPI  Patient presents today for post-COVID care clinic visit.  Patient states that she tested positive for COVID in January 2022.  She states that she was sick again after being exposed to a coworker with COVID 2 to 3 weeks ago.  She tested negative at that time.  Patient states that since that time she has been having ongoing shortness of breath, chest tightness, cough that is productive of thick mucus, and shoulder pain.  Patient does have an albuterol inhaler but does not use it frequently.  Patient does have a history of asthma.  Patient currently does not have a PCP and has received no treatment for her symptoms.  She has not had any imaging done since symptom onset.  She is not currently on any prescription medications other than her albuterol inhaler.  Denies f/c/s, n/v/d, hemoptysis, PND, chest pain or edema.      No Known Allergies  There is no immunization history for the selected administration types on file for this patient.  Past Medical History:  Diagnosis Date   Asthma    Hyperlipidemia     Tobacco History: Social History   Tobacco Use  Smoking Status Former   Years: 8.00   Types: Cigarettes   Quit date: 04/12/2015   Years since quitting: 5.6  Smokeless Tobacco Never   Counseling given: Yes   Outpatient Encounter Medications as of 12/16/2020  Medication Sig   budesonide-formoterol (SYMBICORT) 80-4.5 MCG/ACT inhaler Inhale 2 puffs into the lungs 2 (two) times daily.   predniSONE (DELTASONE) 10 MG tablet Take 4 tabs for 2 days, then 3 tabs for 2 days, then 2 tabs for 2 days, then 1 tab for 2 days, then stop   albuterol (PROAIR HFA) 108 (90 Base) MCG/ACT inhaler Inhale 1-2 puffs into the lungs every 6 (six) hours as needed for wheezing or shortness of breath.   [DISCONTINUED]  acetaminophen (TYLENOL) 325 MG tablet Take 650 mg by mouth every 6 (six) hours as needed (for pain.).   [DISCONTINUED] Ca Carbonate-Mag Hydroxide (ROLAIDS PO) Take 1-2 tablets by mouth 3 (three) times daily as needed (hearburn/indigestion.).   [DISCONTINUED] ibuprofen (ADVIL) 600 MG tablet Take 1 tablet (600 mg total) by mouth every 6 (six) hours.   [DISCONTINUED] Prenatal Vit-Fe Fumarate-FA (MULTIVITAMIN-PRENATAL) 27-0.8 MG TABS tablet Take 1 tablet by mouth daily.   No facility-administered encounter medications on file as of 12/16/2020.     Review of Systems  Review of Systems  Constitutional: Negative.   HENT: Negative.    Respiratory:  Positive for cough, chest tightness and shortness of breath.   Cardiovascular: Negative.   Gastrointestinal: Negative.   Allergic/Immunologic: Negative.   Neurological: Negative.   Psychiatric/Behavioral: Negative.        Physical Exam  BP (!) 132/91   Pulse 67   Temp 97.9 F (36.6 C)   Resp 18   SpO2 98%   Wt Readings from Last 5 Encounters:  11/15/19 221 lb 12.8 oz (100.6 kg)  10/05/19 221 lb 12.8 oz (100.6 kg)  04/21/18 196 lb 4.8 oz (89 kg)  04/11/18 195 lb (88.5 kg)  11/03/15 197 lb (89.4 kg)     Physical Exam Vitals and nursing note reviewed.  Constitutional:      General: She is not in acute  distress.    Appearance: She is well-developed.  Cardiovascular:     Rate and Rhythm: Normal rate and regular rhythm.  Pulmonary:     Effort: Pulmonary effort is normal.     Breath sounds: Normal breath sounds.     Comments: Diminished to bilateral bases Neurological:     Mental Status: She is alert and oriented to person, place, and time.       Assessment & Plan:   History of COVID-19 Cough Shortness of breath:   Stay well hydrated  Stay active  Deep breathing exercises  May start vitamin C 2,000 mg daily, vitamin D3 2,000 IU daily, Zinc 220 mg daily  May take tylenol or fever or pain  May take mucinex DM twice  daily  Will order chest x ray: [facility]  Will order Symbicort  Will order prednisone      Follow up:  Follow up in 2 weeks or sooner if needed     Ivonne Andrew, NP 12/16/2020

## 2020-12-16 NOTE — Patient Instructions (Signed)
Covid 19 Cough Shortness of breath:   Stay well hydrated  Stay active  Deep breathing exercises  May start vitamin C 2,000 mg daily, vitamin D3 2,000 IU daily, Zinc 220 mg daily  May take tylenol or fever or pain  May take mucinex DM twice daily  Will order chest x ray: [place]  Will order Symbicort  Will order prednisone      Follow up:  Follow up in 2 weeks or sooner if needed

## 2020-12-16 NOTE — Assessment & Plan Note (Signed)
Cough Shortness of breath:   Stay well hydrated  Stay active  Deep breathing exercises  May start vitamin C 2,000 mg daily, vitamin D3 2,000 IU daily, Zinc 220 mg daily  May take tylenol or fever or pain  May take mucinex DM twice daily  Will order chest x ray: [facility]  Will order Symbicort  Will order prednisone      Follow up:  Follow up in 2 weeks or sooner if needed

## 2020-12-30 ENCOUNTER — Telehealth (INDEPENDENT_AMBULATORY_CARE_PROVIDER_SITE_OTHER): Payer: Managed Care, Other (non HMO) | Admitting: Nurse Practitioner

## 2020-12-30 ENCOUNTER — Ambulatory Visit: Payer: Managed Care, Other (non HMO)

## 2021-01-15 ENCOUNTER — Ambulatory Visit (INDEPENDENT_AMBULATORY_CARE_PROVIDER_SITE_OTHER): Payer: Managed Care, Other (non HMO) | Admitting: Primary Care

## 2021-12-24 ENCOUNTER — Ambulatory Visit: Payer: Managed Care, Other (non HMO) | Admitting: Physical Therapy

## 2022-01-08 NOTE — Therapy (Deleted)
OUTPATIENT PHYSICAL THERAPY FEMALE PELVIC EVALUATION   Patient Name: Stephanie Lowe MRN: [id number] DOB:1985-07-09, 36 y.o., female Today's Date: 01/08/2022    Past Medical History:  Diagnosis Date   Asthma    Hyperlipidemia    Past Surgical History:  Procedure Laterality Date   none     Patient Active Problem List   Diagnosis Date Noted   History of COVID-19 12/16/2020   Shortness of breath 12/16/2020   Cough 12/16/2020   Term pregnancy 11/15/2019   [redacted] weeks gestation of pregnancy 11/15/2019    PCP: None  REFERRING PROVIDER: Marlow Baars, MD  REFERRING DIAG: N39.3 (ICD-10-CM) - Stress incontinence (female) (female)  THERAPY DIAG:  No diagnosis found.  Rationale for Evaluation and Treatment Rehabilitation  ONSET DATE: ***  SUBJECTIVE:                                                                                                                                                                                           SUBJECTIVE STATEMENT: *** Fluid intake: {Yes/No:304960894}    PAIN:  Are you having pain? {yes/no:20286} NPRS scale: ***/10 Pain location: {pelvic pain location:27098}  Pain type: {type:313116} Pain description: {PAIN DESCRIPTION:21022940}   Aggravating factors: *** Relieving factors: ***  PRECAUTIONS: {Therapy precautions:24002}  WEIGHT BEARING RESTRICTIONS {Yes ***/No:24003}  FALLS:  Has patient fallen in last 6 months? {fallsyesno:27318}  LIVING ENVIRONMENT: Lives with: {[facility] lives with:25569::"lives with their family"} Lives in: {Lives in:25570} Stairs: {opstairs:27293} Has following equipment at home: {Assistive devices:23999}  OCCUPATION: ***  PLOF: {PLOF:24004}  PATIENT GOALS ***  PERTINENT HISTORY:  *** Sexual abuse: {Yes/No:304960894}  BOWEL MOVEMENT Pain with bowel movement: {yes/no:20286} Type of bowel movement:{PT BM type:27100} Fully empty rectum: {Yes/No:304960894} Leakage: {Yes/No:304960894} Pads:  {Yes/No:304960894} Fiber supplement: {Yes/No:304960894}  URINATION Pain with urination: {yes/no:20286} Fully empty bladder: {Yes/No:304960894} Stream: {PT urination:27102} Urgency: {Yes/No:304960894} Frequency: *** Leakage: {PT leakage:27103} Pads: {Yes/No:304960894}  INTERCOURSE Pain with intercourse: {pain with intercourse PA:27099} Ability to have vaginal penetration:  {Yes/No:304960894} Climax: *** Marinoff Scale: ***/3  PREGNANCY Vaginal deliveries *** Tearing {Yes***/No:304960894} C-section deliveries *** Currently pregnant {Yes***/No:304960894} Vaginal birth 11/15/2019 with 1st degree tear  PROLAPSE {PT prolapse:27101}    OBJECTIVE:   DIAGNOSTIC FINDINGS:  ***  PATIENT SURVEYS:  {rehab surveys:24030}  PFIQ-7 ***  COGNITION:  Overall cognitive status: {cognition:24006}     SENSATION:  Light touch: {intact/deficits:24005}  Proprioception: {intact/deficits:24005}  MUSCLE LENGTH: Hamstrings: Right *** deg; Left *** deg Thomas test: Right *** deg; Left *** deg  LUMBAR SPECIAL TESTS:  {lumbar special test:25242}  FUNCTIONAL TESTS:  {Functional tests:24029}  GAIT: Distance walked: *** Assistive device utilized: {Assistive devices:23999} Level of assistance: {Levels of  assistance:24026} Comments: ***               POSTURE: {posture:25561}   PELVIC ALIGNMENT:  LUMBARAROM/PROM  A/PROM A/PROM  eval  Flexion   Extension   Right lateral flexion   Left lateral flexion   Right rotation   Left rotation    (Blank rows = not tested)  LOWER EXTREMITY ROM:  {AROM/PROM:27142} ROM Right eval Left eval  Hip flexion    Hip extension    Hip abduction    Hip adduction    Hip internal rotation    Hip external rotation    Knee flexion    Knee extension    Ankle dorsiflexion    Ankle plantarflexion    Ankle inversion    Ankle eversion     (Blank rows = not tested)  LOWER EXTREMITY MMT:  MMT Right eval Left eval  Hip flexion    Hip  extension    Hip abduction    Hip adduction    Hip internal rotation    Hip external rotation    Knee flexion    Knee extension    Ankle dorsiflexion    Ankle plantarflexion    Ankle inversion    Ankle eversion      PALPATION:   General  ***                External Perineal Exam ***                             Internal Pelvic Floor ***  Patient confirms identification and approves PT to assess internal pelvic floor and treatment {yes/no:20286}  PELVIC MMT:   MMT eval  Vaginal   Internal Anal Sphincter   External Anal Sphincter   Puborectalis   Diastasis Recti   (Blank rows = not tested)        TONE: ***  PROLAPSE: ***  TODAY'S TREATMENT  EVAL ***   PATIENT EDUCATION:  Education details: *** Person educated: {Person educated:25204} Education method: {Education Method:25205} Education comprehension: {Education Comprehension:25206}   HOME EXERCISE PROGRAM: ***  ASSESSMENT:  CLINICAL IMPRESSION: Patient is a *** y.o. *** who was seen today for physical therapy evaluation and treatment for ***.    OBJECTIVE IMPAIRMENTS {opptimpairments:25111}.   ACTIVITY LIMITATIONS {activitylimitations:27494}  PARTICIPATION LIMITATIONS: {participationrestrictions:25113}  PERSONAL FACTORS {Personal factors:25162} are also affecting patient's functional outcome.   REHAB POTENTIAL: {rehabpotential:25112}  CLINICAL DECISION MAKING: {clinical decision making:25114}  EVALUATION COMPLEXITY: {Evaluation complexity:25115}   GOALS: Goals reviewed with patient? {yes/no:20286}  SHORT TERM GOALS: Target date: {follow up:25551}  *** Baseline: Goal status: {GOALSTATUS:25110}  2.  *** Baseline:  Goal status: {GOALSTATUS:25110}  3.  *** Baseline:  Goal status: {GOALSTATUS:25110}  4.  *** Baseline:  Goal status: {GOALSTATUS:25110}  5.  *** Baseline:  Goal status: {GOALSTATUS:25110}  6.  *** Baseline:  Goal status: {GOALSTATUS:25110}  LONG TERM GOALS:  Target date: {follow up:25551}   *** Baseline:  Goal status: {GOALSTATUS:25110}  2.  *** Baseline:  Goal status: {GOALSTATUS:25110}  3.  *** Baseline:  Goal status: {GOALSTATUS:25110}  4.  *** Baseline:  Goal status: {GOALSTATUS:25110}  5.  *** Baseline:  Goal status: {GOALSTATUS:25110}  6.  *** Baseline:  Goal status: {GOALSTATUS:25110}  PLAN: PT FREQUENCY: {rehab frequency:25116}  PT DURATION: {rehab duration:25117}  PLANNED INTERVENTIONS: {rehab planned interventions:25118::"Therapeutic exercises","Therapeutic activity","Neuromuscular re-education","Balance training","Gait training","Patient/Family education","Self Care","Joint mobilization"}  PLAN FOR NEXT SESSION: ***   Chord Takahashi, PT 01/08/2022, 10:30 AM

## 2022-01-11 ENCOUNTER — Ambulatory Visit: Payer: Managed Care, Other (non HMO) | Attending: Obstetrics | Admitting: Physical Therapy

## 2022-08-04 ENCOUNTER — Ambulatory Visit: Payer: Managed Care, Other (non HMO) | Admitting: Podiatry

## 2023-02-07 ENCOUNTER — Other Ambulatory Visit (HOSPITAL_COMMUNITY): Payer: Self-pay

## 2023-02-07 MED ORDER — WEGOVY 0.25 MG/0.5ML ~~LOC~~ SOAJ
0.2500 mg | SUBCUTANEOUS | 0 refills | Status: DC
  Filled 2023-02-07 – 2023-02-08 (×3): qty 2, 28d supply, fill #0

## 2023-02-08 ENCOUNTER — Other Ambulatory Visit: Payer: Self-pay

## 2023-02-08 ENCOUNTER — Other Ambulatory Visit (HOSPITAL_COMMUNITY): Payer: Self-pay

## 2023-02-14 ENCOUNTER — Other Ambulatory Visit (HOSPITAL_COMMUNITY): Payer: Self-pay

## 2023-02-24 LAB — HM PAP SMEAR: HM Pap smear: NEGATIVE

## 2023-03-10 ENCOUNTER — Other Ambulatory Visit (HOSPITAL_COMMUNITY): Payer: Self-pay

## 2023-03-25 ENCOUNTER — Other Ambulatory Visit (HOSPITAL_COMMUNITY): Payer: Self-pay

## 2023-08-15 ENCOUNTER — Ambulatory Visit: Admitting: Family Medicine

## 2023-08-22 ENCOUNTER — Encounter: Payer: Self-pay | Admitting: Urgent Care

## 2023-08-22 ENCOUNTER — Ambulatory Visit (INDEPENDENT_AMBULATORY_CARE_PROVIDER_SITE_OTHER): Admitting: Urgent Care

## 2023-08-22 VITALS — BP 138/88 | HR 88 | Ht 64.0 in | Wt 237.8 lb

## 2023-08-22 DIAGNOSIS — R0683 Snoring: Secondary | ICD-10-CM

## 2023-08-22 DIAGNOSIS — G6289 Other specified polyneuropathies: Secondary | ICD-10-CM

## 2023-08-22 DIAGNOSIS — R2231 Localized swelling, mass and lump, right upper limb: Secondary | ICD-10-CM | POA: Diagnosis not present

## 2023-08-22 DIAGNOSIS — R1013 Epigastric pain: Secondary | ICD-10-CM | POA: Diagnosis not present

## 2023-08-22 DIAGNOSIS — R7303 Prediabetes: Secondary | ICD-10-CM

## 2023-08-22 DIAGNOSIS — R5383 Other fatigue: Secondary | ICD-10-CM

## 2023-08-22 LAB — B12 AND FOLATE PANEL
Folate: 8.5 ng/mL (ref >5.9)
Vitamin B-12: 381 pg/mL (ref 211–911)

## 2023-08-22 NOTE — Patient Instructions (Signed)
 I have ordered a diagnostic mammogram and breast ultrasound at the breast center.   We have ordered a home sleep study from [facility]. They should contact you, however if you have not heard anything within 5 business days, you can contact them directly at [phone].  Please use 2 puffs of your albuterol every 4-6 hours as needed for shortness of breath. If your symptoms continue, we will adjust your treatment plan upon follow up  We drew labs today to assess the cause of your symptoms.  Please monitor your blood pressure at home and bring BP log back to next office visit.

## 2023-08-22 NOTE — Progress Notes (Signed)
 New Patient Office Visit  Subjective:  Patient ID: Stephanie Lowe, female    DOB: Dec 15, 1985  Age: 38 y.o. MRN: [id number]  CC:  Chief Complaint  Patient presents with   Establish Care    New pt est care. She has a knot under her right arm that has been causing breast pain. She also has issue with acid reflux. Pt sees Stephanie Lowe for paps. Pt was on [facility] for weight loss but wants to see if she could try something else.    HPI Stephanie Lowe presents to establish care.  Discussed the use of AI scribe software for clinical note transcription with the patient, who gave verbal consent to proceed.  History of Present Illness   Stephanie Lowe is a 38 year old female who presents with a lump under her armpit and associated breast pain.  A lump under the armpit was discovered on February 28th, initially noticed by her mother. The lump caused significant breast pain, described as 'on fire,' which persisted for a few days before subsiding. She has not previously performed regular breast self-examinations and has not had this issue evaluated before.  Approximately two weeks ago, she experienced mid-abdominal pain exacerbated by eating and drinking, accompanied by throat pain. Over-the-counter antacids such as Pepto Bismol, Tums, and famotidine provided no relief. The pain was present even on an empty stomach and was associated with nausea and constipation, described as passing 'small pellets.' These symptoms have improved but are still present to a lesser degree. No bloating, diarrhea, or history of H. pylori infection.  She was on Wegovy from October until the last week of February, reaching a dose of 1.7 mg, but discontinued due to side effects including nausea and being bedridden. She did not experience significant weight loss during this period. Her last dose was approximately six weeks ago. She is concerned about similar side effects with other medications.  She has a history  of asthma and previously smoked tobacco but has since quit due to asthma. No current smoking or use of anti-inflammatories.       Outpatient Encounter Medications as of 08/22/2023  Medication Sig   albuterol (PROAIR HFA) 108 (90 Base) MCG/ACT inhaler Inhale 1-2 puffs into the lungs every 6 (six) hours as needed for wheezing or shortness of breath.   [DISCONTINUED] budesonide-formoterol (SYMBICORT) 80-4.5 MCG/ACT inhaler Inhale 2 puffs into the lungs 2 (two) times daily. (Patient not taking: Reported on 08/22/2023)   [DISCONTINUED] predniSONE (DELTASONE) 10 MG tablet Take 4 tabs for 2 days, then 3 tabs for 2 days, then 2 tabs for 2 days, then 1 tab for 2 days, then stop (Patient not taking: Reported on 08/22/2023)   [DISCONTINUED] Semaglutide-Weight Management (WEGOVY) 0.25 MG/0.5ML SOAJ Inject 0.25 mg into the skin once a week. (Patient not taking: Reported on 08/22/2023)   No facility-administered encounter medications on file as of 08/22/2023.    Past Medical History:  Diagnosis Date   Asthma    Diverticulitis    Hyperlipidemia    Migraines     Past Surgical History:  Procedure Laterality Date   none      Family History  Problem Relation Age of Onset   Clotting disorder Mother    Diverticulosis Mother    Colon cancer Neg Hx    Esophageal cancer Neg Hx    Pancreatic cancer Neg Hx    Stomach cancer Neg Hx    Liver disease Neg Hx     Social History   Socioeconomic  History   Marital status: Single    Spouse name: Not on file   Number of children: Not on file   Years of education: Not on file   Highest education level: Not on file  Occupational History   Not on file  Tobacco Use   Smoking status: Former    Current packs/day: 0.00    Types: Cigarettes    Start date: 04/12/2007    Quit date: 04/12/2015    Years since quitting: 8.3   Smokeless tobacco: Never  Substance and Sexual Activity   Alcohol use: Yes    Alcohol/week: 7.0 standard drinks of alcohol    Types: 3 Cans of  beer, 4 Standard drinks or equivalent per week   Drug use: Not Currently   Sexual activity: Not Currently    Partners: Male  Other Topics Concern   Not on file  Social History Narrative   Not on file   Social Drivers of Health   Financial Resource Strain: Not on file  Food Insecurity: Not on file  Transportation Needs: Not on file  Physical Activity: Not on file  Stress: Not on file  Social Connections: Not on file  Intimate Partner Violence: Not on file    ROS: as noted in HPI  Objective:  BP 138/88   Pulse 88   Ht 5\' 4"  (1.626 m)   Wt 237 lb 12.8 oz (107.9 kg)   SpO2 97%   BMI 40.82 kg/m   Physical Exam Vitals and nursing note reviewed.  Constitutional:      General: She is not in acute distress.    Appearance: Normal appearance. She is not ill-appearing, toxic-appearing or diaphoretic.  HENT:     Head: Normocephalic and atraumatic.     Right Ear: Tympanic membrane, ear canal and external ear normal. There is no impacted cerumen.     Left Ear: Tympanic membrane, ear canal and external ear normal. There is no impacted cerumen.     Nose: Nose normal.     Mouth/Throat:     Mouth: Mucous membranes are moist.     Pharynx: Oropharynx is clear. No oropharyngeal exudate or posterior oropharyngeal erythema.  Eyes:     General: No scleral icterus.       Right eye: No discharge.        Left eye: No discharge.     Extraocular Movements: Extraocular movements intact.     Pupils: Pupils are equal, round, and reactive to light.  Neck:     Thyroid: No thyroid mass, thyromegaly or thyroid tenderness.  Cardiovascular:     Rate and Rhythm: Normal rate and regular rhythm.     Pulses: Normal pulses.     Heart sounds: No murmur heard. Pulmonary:     Effort: Pulmonary effort is normal. No respiratory distress.     Breath sounds: Normal breath sounds. No stridor. No wheezing or rhonchi.  Abdominal:     General: Abdomen is flat. Bowel sounds are normal. There is no distension.      Palpations: Abdomen is soft. There is no mass.     Tenderness: There is abdominal tenderness in the epigastric area and left upper quadrant. There is no guarding.     Comments: Minimal  Musculoskeletal:     Cervical back: Normal range of motion and neck supple. No rigidity or tenderness.     Right lower leg: No edema.     Left lower leg: No edema.  Lymphadenopathy:     Cervical: No cervical  adenopathy.  Skin:    General: Skin is warm and dry.     Coloration: Skin is not jaundiced.     Findings: No bruising, erythema or rash.  Neurological:     General: No focal deficit present.     Mental Status: She is alert and oriented to person, place, and time.     Sensory: No sensory deficit.     Motor: No weakness.  Psychiatric:        Mood and Affect: Mood normal.        Behavior: Behavior normal.       Assessment & Plan:  Axillary mass, right -     [place] LIMITED ULTRASOUND INCLUDING AXILLA RIGHT BREAST; Future -     Hemoglobin A1c -     MM 3D DIAGNOSTIC MAMMOGRAM BILATERAL BREAST; Future  Prediabetes  Epigastric pain -     Lipase -     CBC with Differential/Platelet -     Comprehensive metabolic panel with GFR  Snoring  Other fatigue -     B12 and Folate Panel  Other polyneuropathy -     B12 and Folate Panel  Assessment and Plan    Epigastric Pain Epigastric pain for two weeks, possibly due to gastritis, peptic ulcer disease, or pancreatitis. Discontinued Wegovy due to side effects, including potential pancreatitis. - Order lipase test to rule out pancreatitis. - Recheck A1c to assess for diabetes.  Prediabetes A1c in July 2024 was 6.0. Discussed alternative weight loss medications and potential use of Rybelsus if A1c indicates diabetes. Explained tirzepatide and semaglutide are in the same class with similar side effects. - Discuss potential weight loss medications if pancreatitis is ruled out. - Consider alternative weight loss strategies if GLP-1 receptor agonists  are not tolerated.  Axillary Lump Lump under right axilla noticed on July 15, 2023, with initial breast pain that resolved. - Perform a comprehensive physical examination of the axillary area and breast.     Snoring and fatigue Ordered home sleep study through [facility]  Tingling in fingers B12 ordered today   Return in about 2 weeks (around 09/05/2023).   Maretta Bees, PA

## 2023-08-23 ENCOUNTER — Encounter: Payer: Self-pay | Admitting: Urgent Care

## 2023-08-23 LAB — CBC WITH DIFFERENTIAL/PLATELET
Basophils Absolute: 0 10*3/uL (ref 0.0–0.2)
Basos: 0 %
EOS (ABSOLUTE): 0.2 10*3/uL (ref 0.0–0.4)
Eos: 3 %
Hematocrit: 38.1 % (ref 34.0–46.6)
Hemoglobin: 12.4 g/dL (ref 11.1–15.9)
Immature Grans (Abs): 0 10*3/uL (ref 0.0–0.1)
Immature Granulocytes: 0 %
Lymphocytes Absolute: 2.1 10*3/uL (ref 0.7–3.1)
Lymphs: 25 %
MCH: 28.9 pg (ref 26.6–33.0)
MCHC: 32.5 g/dL (ref 31.5–35.7)
MCV: 89 fL (ref 79–97)
Monocytes Absolute: 0.4 10*3/uL (ref 0.1–0.9)
Monocytes: 5 %
Neutrophils Absolute: 5.5 10*3/uL (ref 1.4–7.0)
Neutrophils: 67 %
Platelets: 417 10*3/uL (ref 150–450)
RBC: 4.29 x10E6/uL (ref 3.77–5.28)
RDW: 12.6 % (ref 11.7–15.4)
WBC: 8.2 10*3/uL (ref 3.4–10.8)

## 2023-08-23 LAB — LIPASE: Lipase: 33 U/L (ref 14–72)

## 2023-08-23 LAB — COMPREHENSIVE METABOLIC PANEL WITH GFR
ALT: 15 IU/L (ref 0–32)
AST: 11 IU/L (ref 0–40)
Albumin: 4.1 g/dL (ref 3.9–4.9)
Alkaline Phosphatase: 67 IU/L (ref 44–121)
BUN/Creatinine Ratio: 13 (ref 9–23)
BUN: 10 mg/dL (ref 6–20)
Bilirubin Total: <0.2 mg/dL (ref 0.0–1.2)
CO2: 22 mmol/L (ref 20–29)
Calcium: 9.1 mg/dL (ref 8.7–10.2)
Chloride: 104 mmol/L (ref 96–106)
Creatinine, Ser: 0.78 mg/dL (ref 0.57–1.00)
Globulin, Total: 2.9 g/dL (ref 1.5–4.5)
Glucose: 145 mg/dL — ABNORMAL HIGH (ref 70–99)
Potassium: 4.4 mmol/L (ref 3.5–5.2)
Sodium: 142 mmol/L (ref 134–144)
Total Protein: 7 g/dL (ref 6.0–8.5)
eGFR: 101 mL/min/{1.73_m2} (ref >59)

## 2023-08-23 LAB — HEMOGLOBIN A1C
Est. average glucose Bld gHb Est-mCnc: 120 mg/dL
Hgb A1c MFr Bld: 5.8 % — ABNORMAL HIGH (ref 4.8–5.6)

## 2023-08-30 LAB — SPECIMEN STATUS REPORT

## 2023-08-30 LAB — HGB A1C W/O EAG: Hgb A1c MFr Bld: 5.6 % (ref 4.8–5.6)

## 2023-09-05 ENCOUNTER — Ambulatory Visit: Admitting: Urgent Care

## 2023-09-06 ENCOUNTER — Ambulatory Visit
Admission: RE | Admit: 2023-09-06 | Discharge: 2023-09-06 | Disposition: A | Discharge status: Home / Self Care | Source: Ambulatory Visit | Attending: Urgent Care | Admitting: Urgent Care

## 2023-09-06 ENCOUNTER — Encounter: Payer: Self-pay | Admitting: Urgent Care

## 2023-09-06 DIAGNOSIS — R2231 Localized swelling, mass and lump, right upper limb: Secondary | ICD-10-CM

## 2023-09-12 ENCOUNTER — Encounter: Payer: Self-pay | Admitting: Urgent Care

## 2023-09-12 ENCOUNTER — Ambulatory Visit (INDEPENDENT_AMBULATORY_CARE_PROVIDER_SITE_OTHER): Admitting: Urgent Care

## 2023-09-12 VITALS — BP 134/94 | HR 92 | Wt 237.8 lb

## 2023-09-12 DIAGNOSIS — F411 Generalized anxiety disorder: Secondary | ICD-10-CM | POA: Diagnosis not present

## 2023-09-12 DIAGNOSIS — F430 Acute stress reaction: Secondary | ICD-10-CM | POA: Diagnosis not present

## 2023-09-12 DIAGNOSIS — F5089 Other specified eating disorder: Secondary | ICD-10-CM

## 2023-09-12 DIAGNOSIS — Z6841 Body Mass Index (BMI) 40.0 and over, adult: Secondary | ICD-10-CM

## 2023-09-12 MED ORDER — BUSPIRONE HCL 10 MG PO TABS: ORAL_TABLET | ORAL | 2 refills | Status: AC

## 2023-09-12 MED ORDER — NALTREXONE-BUPROPION HCL ER 8-90 MG PO TB12: ORAL_TABLET | ORAL | 2 refills | Status: DC

## 2023-09-12 NOTE — Patient Instructions (Signed)
 Please add Buspirone to help with anxiety. Start with 1/2 tab once daily x 5 days, then increase to 1/2 tab twice daily x 5 days, then 1 tab in AM, 1/2 tab in PM x 5 days, then continue with 1 tab twice daily.   After you reach maintenance dose of buspar, please start Contrave. This will help with over-eating.   Please return in 5 weeks for follow up.  Look into the [employer] by [employer]
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
## 2023-09-15 ENCOUNTER — Telehealth: Payer: Self-pay

## 2023-09-15 ENCOUNTER — Encounter: Payer: Self-pay | Admitting: Urgent Care

## 2023-09-15 DIAGNOSIS — Z6841 Body Mass Index (BMI) 40.0 and over, adult: Secondary | ICD-10-CM

## 2023-09-15 DIAGNOSIS — F5089 Other specified eating disorder: Secondary | ICD-10-CM

## 2023-09-15 NOTE — Telephone Encounter (Signed)
 error

## 2023-09-15 NOTE — Telephone Encounter (Signed)
 Pt is aware Laurina Popper is not in office

## 2023-09-22 MED ORDER — NALTREXONE-BUPROPION HCL ER 8-90 MG PO TB12: ORAL_TABLET | ORAL | 2 refills | Status: AC

## 2023-10-17 ENCOUNTER — Ambulatory Visit: Admitting: Urgent Care

## 2024-01-26 ENCOUNTER — Encounter: Payer: Self-pay | Admitting: *Deleted

## 2024-01-26 ENCOUNTER — Other Ambulatory Visit: Payer: Self-pay

## 2024-01-26 ENCOUNTER — Ambulatory Visit
Admission: EM | Admit: 2024-01-26 | Discharge: 2024-01-26 | Disposition: A | Discharge status: Home / Self Care | Payer: Self-pay | Attending: Nurse Practitioner | Admitting: Nurse Practitioner

## 2024-01-26 DIAGNOSIS — L089 Local infection of the skin and subcutaneous tissue, unspecified: Secondary | ICD-10-CM

## 2024-01-26 DIAGNOSIS — T148XXA Other injury of unspecified body region, initial encounter: Secondary | ICD-10-CM

## 2024-01-26 DIAGNOSIS — T2112XA Burn of first degree of abdominal wall, initial encounter: Secondary | ICD-10-CM

## 2024-01-26 MED ORDER — SULFAMETHOXAZOLE-TRIMETHOPRIM 800-160 MG PO TABS: 1.0000 | ORAL_TABLET | Freq: Two times a day (BID) | ORAL | 0 refills | Status: AC
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
## 2024-05-28 ENCOUNTER — Ambulatory Visit: Admitting: Urgent Care

## 2024-06-08 ENCOUNTER — Ambulatory Visit: Admitting: Urgent Care

## 2024-06-14 ENCOUNTER — Ambulatory Visit: Admitting: Urgent Care
